# Patient Record
Sex: Male | Born: 1970 | Race: White | Hispanic: No | Marital: Single | Smoking: Never smoker
Health system: Southern US, Community
[De-identification: ages and names within clinical notes are randomized; demographics above are authoritative.]

## PROBLEM LIST (undated history)

## (undated) DIAGNOSIS — S065XAA Traumatic subdural hemorrhage with loss of consciousness status unknown, initial encounter: Secondary | ICD-10-CM

## (undated) DIAGNOSIS — F1910 Other psychoactive substance abuse, uncomplicated: Secondary | ICD-10-CM

## (undated) DIAGNOSIS — T7840XA Allergy, unspecified, initial encounter: Secondary | ICD-10-CM

## (undated) DIAGNOSIS — I10 Essential (primary) hypertension: Secondary | ICD-10-CM

## (undated) DIAGNOSIS — F32A Depression, unspecified: Secondary | ICD-10-CM

## (undated) DIAGNOSIS — J45909 Unspecified asthma, uncomplicated: Secondary | ICD-10-CM

## (undated) DIAGNOSIS — F329 Major depressive disorder, single episode, unspecified: Secondary | ICD-10-CM

## (undated) DIAGNOSIS — G43909 Migraine, unspecified, not intractable, without status migrainosus: Secondary | ICD-10-CM

## (undated) HISTORY — DX: Major depressive disorder, single episode, unspecified: F32.9

## (undated) HISTORY — DX: Unspecified asthma, uncomplicated: J45.909

## (undated) HISTORY — DX: Depression, unspecified: F32.A

## (undated) HISTORY — DX: Other psychoactive substance abuse, uncomplicated: F19.10

## (undated) HISTORY — DX: Essential (primary) hypertension: I10

## (undated) HISTORY — DX: Allergy, unspecified, initial encounter: T78.40XA

## (undated) HISTORY — PX: NO PAST SURGERIES: SHX2092

## (undated) HISTORY — DX: Traumatic subdural hemorrhage with loss of consciousness status unknown, initial encounter: S06.5XAA

## (undated) HISTORY — DX: Migraine, unspecified, not intractable, without status migrainosus: G43.909

---

## 2005-06-13 ENCOUNTER — Emergency Department: Payer: Self-pay | Admitting: Emergency Medicine

## 2017-08-07 ENCOUNTER — Other Ambulatory Visit: Payer: Self-pay | Admitting: Infectious Diseases

## 2017-08-07 DIAGNOSIS — F1010 Alcohol abuse, uncomplicated: Secondary | ICD-10-CM

## 2017-08-07 DIAGNOSIS — R7989 Other specified abnormal findings of blood chemistry: Secondary | ICD-10-CM

## 2017-08-07 DIAGNOSIS — R945 Abnormal results of liver function studies: Secondary | ICD-10-CM

## 2017-08-11 ENCOUNTER — Ambulatory Visit
Admission: RE | Admit: 2017-08-11 | Discharge: 2017-08-11 | Disposition: A | Discharge status: Home / Self Care | Payer: PRIVATE HEALTH INSURANCE | Source: Ambulatory Visit | Attending: Infectious Diseases | Admitting: Infectious Diseases

## 2017-08-11 DIAGNOSIS — R7989 Other specified abnormal findings of blood chemistry: Secondary | ICD-10-CM

## 2017-08-11 DIAGNOSIS — R945 Abnormal results of liver function studies: Secondary | ICD-10-CM | POA: Diagnosis present

## 2017-08-11 DIAGNOSIS — F1010 Alcohol abuse, uncomplicated: Secondary | ICD-10-CM

## 2017-12-18 DIAGNOSIS — G8929 Other chronic pain: Secondary | ICD-10-CM | POA: Insufficient documentation

## 2017-12-26 ENCOUNTER — Other Ambulatory Visit: Payer: Self-pay | Admitting: Internal Medicine

## 2017-12-26 DIAGNOSIS — M25511 Pain in right shoulder: Principal | ICD-10-CM

## 2017-12-26 DIAGNOSIS — G8929 Other chronic pain: Secondary | ICD-10-CM

## 2018-01-20 ENCOUNTER — Ambulatory Visit
Admission: RE | Admit: 2018-01-20 | Discharge: 2018-01-20 | Disposition: A | Discharge status: Home / Self Care | Payer: BLUE CROSS/BLUE SHIELD | Source: Ambulatory Visit | Attending: Internal Medicine | Admitting: Internal Medicine

## 2018-01-20 DIAGNOSIS — M25511 Pain in right shoulder: Principal | ICD-10-CM

## 2018-01-20 DIAGNOSIS — G8929 Other chronic pain: Secondary | ICD-10-CM

## 2018-02-01 DIAGNOSIS — M2550 Pain in unspecified joint: Secondary | ICD-10-CM | POA: Insufficient documentation

## 2019-01-02 ENCOUNTER — Ambulatory Visit: Payer: Self-pay

## 2019-01-03 ENCOUNTER — Ambulatory Visit: Payer: Self-pay | Admitting: Adult Health

## 2019-01-03 ENCOUNTER — Other Ambulatory Visit: Payer: Self-pay

## 2019-01-03 ENCOUNTER — Encounter: Payer: Self-pay | Admitting: Adult Health

## 2019-01-03 VITALS — BP 112/76 | HR 93 | Ht 67.0 in | Wt 170.0 lb

## 2019-01-03 DIAGNOSIS — J45909 Unspecified asthma, uncomplicated: Secondary | ICD-10-CM | POA: Insufficient documentation

## 2019-01-03 DIAGNOSIS — F1910 Other psychoactive substance abuse, uncomplicated: Secondary | ICD-10-CM | POA: Insufficient documentation

## 2019-01-03 DIAGNOSIS — J0111 Acute recurrent frontal sinusitis: Secondary | ICD-10-CM

## 2019-01-03 DIAGNOSIS — Z23 Encounter for immunization: Secondary | ICD-10-CM

## 2019-01-03 DIAGNOSIS — F1010 Alcohol abuse, uncomplicated: Secondary | ICD-10-CM | POA: Insufficient documentation

## 2019-01-03 DIAGNOSIS — F339 Major depressive disorder, recurrent, unspecified: Secondary | ICD-10-CM | POA: Insufficient documentation

## 2019-01-03 DIAGNOSIS — Z0000 Encounter for general adult medical examination without abnormal findings: Secondary | ICD-10-CM

## 2019-01-03 MED ORDER — FOLIC ACID 1 MG PO TABS: 1.0000 mg | ORAL_TABLET | Freq: Every day | ORAL | 2 refills | Status: DC

## 2019-01-03 MED ORDER — FLUTICASONE PROPIONATE HFA 110 MCG/ACT IN AERO: 1.0000 | INHALATION_SPRAY | Freq: Two times a day (BID) | RESPIRATORY_TRACT | 12 refills | Status: DC

## 2019-01-03 MED ORDER — ALBUTEROL SULFATE HFA 108 (90 BASE) MCG/ACT IN AERS: 2.0000 | INHALATION_SPRAY | RESPIRATORY_TRACT | 2 refills | Status: DC | PRN

## 2019-01-03 MED ORDER — CETIRIZINE HCL 10 MG PO TABS: 10.0000 mg | ORAL_TABLET | Freq: Every day | ORAL | 2 refills | Status: DC | PRN

## 2019-01-03 MED ORDER — FLUTICASONE PROPIONATE HFA 220 MCG/ACT IN AERO: 1.0000 | INHALATION_SPRAY | RESPIRATORY_TRACT | 2 refills | Status: DC

## 2019-01-03 MED ORDER — THERA VITAL M PO TABS: 1.0000 | ORAL_TABLET | Freq: Every day | ORAL | 2 refills | Status: DC

## 2019-01-03 MED ORDER — FLUTICASONE PROPIONATE 50 MCG/ACT NA SUSP: 2.0000 | Freq: Every day | NASAL | 2 refills | Status: DC

## 2019-01-03 MED ORDER — VITAMIN B-1 100 MG PO TABS: 100.0000 mg | ORAL_TABLET | Freq: Every day | ORAL | 2 refills | Status: DC

## 2019-01-03 MED ORDER — SERTRALINE HCL 100 MG PO TABS: 100.0000 mg | ORAL_TABLET | Freq: Every day | ORAL | 2 refills | Status: DC

## 2019-01-03 MED ORDER — SALINE SPRAY 0.65 % NA SOLN: 2.0000 | Freq: Four times a day (QID) | NASAL | 2 refills | Status: DC | PRN

## 2019-01-03 NOTE — Progress Notes (Signed)
Patient ID: Cameron [name], male   DOB: 1971/01/03, 48 y.o.   MRN: [id number]  Chief Complaint  Patient presents with  . New Patient (Initial Visit)    establish care    HPI Cameron [name] is a 48 y.o. male who presents for an initial office visit and evaluation of depression, polysubstance abuse and asthma due to allergies. He is c/o sinus congestion, drainage and PND that started few days ago and has gradually gotten worse. He denies fever, chills and muscle aches. Also complaining of pain in multiple joints that is on and off. He was screened for RA but was found to be negative.  He is currently in a drug treatment program for alcohol abuse.  He denies chest pain, palpitations, dizziness and headaches.  He is not up-to-date in all his immunizations.  He needs an influenza vaccine, Tdap and pneumonia.  He reports poor adherence to daily exercise.  He is currently a smokeless tobacco user.  Past Medical History:  Diagnosis Date  . Allergy   . Asthma   . Depression   . Substance abuse ([place])     History reviewed. No pertinent surgical history.  Family History  Problem Relation Age of Onset  . Diabetes Father     Social History Social History   Tobacco Use  . Smoking status: Never Smoker  . Smokeless tobacco: Current User    Types: Chew  Substance Use Topics  . Alcohol use: Not Currently  . Drug use: Never    No Known Allergies  Current Outpatient Medications  Medication Sig Dispense Refill  . fluticasone (FLOVENT HFA) 220 MCG/ACT inhaler Inhale into the lungs.    Marland Kitchen albuterol (PROVENTIL HFA;VENTOLIN HFA) 108 (90 Base) MCG/ACT inhaler     . sertraline (ZOLOFT) 100 MG tablet Take 100 mg by mouth daily.     No current facility-administered medications for this visit.     Review of Systems Review of Systems  Constitutional: Negative.   HENT: Positive for congestion, postnasal drip, rhinorrhea, sinus pressure and sinus pain.   Eyes: Negative.   Respiratory:  Positive for cough and wheezing. Negative for shortness of breath.   Cardiovascular: Negative.   Gastrointestinal: Negative.   Endocrine: Negative.   Genitourinary: Negative.   Musculoskeletal: Negative.   Skin: Negative.   Allergic/Immunologic: Positive for environmental allergies.  Neurological: Negative.   Hematological: Negative.   Psychiatric/Behavioral: Negative.     Blood pressure 112/76, pulse 93, height 5' 7"  (1.702 m), weight 170 lb (77.1 kg), SpO2 93 %.  Physical Exam Physical Exam Vitals signs and nursing note reviewed.  Constitutional:      Appearance: Normal appearance.  HENT:     Head: Normocephalic and atraumatic.     Comments: Pain with palpation of maxillary sinuses    Nose: Nose normal.     Mouth/Throat:     Mouth: Mucous membranes are dry.     Pharynx: Oropharynx is clear.  Eyes:     Extraocular Movements: Extraocular movements intact.     Conjunctiva/sclera: Conjunctivae normal.     Pupils: Pupils are equal, round, and reactive to light.  Neck:     Musculoskeletal: Normal range of motion and neck supple.  Cardiovascular:     Rate and Rhythm: Normal rate and regular rhythm.     Pulses: Normal pulses.     Heart sounds: Normal heart sounds.  Pulmonary:     Effort: Pulmonary effort is normal.     Breath sounds: Normal breath  sounds. No wheezing or rhonchi.  Abdominal:     General: Bowel sounds are normal. There is no distension.     Palpations: Abdomen is soft.     Tenderness: There is no abdominal tenderness. There is no guarding.  Genitourinary:    Comments: Deferred per patient's preference Musculoskeletal: Normal range of motion.  Skin:    General: Skin is warm and dry.     Capillary Refill: Capillary refill takes less than 2 seconds.  Neurological:     General: No focal deficit present.     Mental Status: He is alert and oriented to person, place, and time.  Psychiatric:        Mood and Affect: Mood normal.        Behavior: Behavior normal.      Assessment and plan 1. Need for immunization against influenza Flu shot given - Flu Vaccine QUAD 6+ mos IM (Fluarix)  2. Health maintenance examination We will obtain baseline labs and screen for cardiovascular risk factors. - CBC w/Diff - Comp Met (CMET) - Lipid Profile - TSH - HgB A1c - Magnesium - Phosphorus - Vitamin B1 - Folate  3. Asthma due to environmental allergies Continue rescue inhaler and Flovent - DG Chest 2 View; Future - Ambulatory referral to Pulmonology  4. Depression, recurrent ([place]) Continue follow-up with psychiatrist, counselor and continue Zoloft 100 mg nightly  5. Substance abuse (HCC)-Alcohol abuse We will start on thiamine, folic acid and multivitamin.  Will obtain magnesium and phosphate B12 and folate levels - Magnesium - Phosphorus - Vitamin B1 - Folate  6. Acute recurrent frontal sinusitis Likely due to seasonal allergies.  Will start on Flonase 2 sprays in each nostril at bedtime and saline nasal spray 2 sprays in each nostril every 4 hours as needed.  He is not febrile and does not have any signs of active infection.  Will hold off on antibiotics at this point.  Patient encouraged to return to the clinic or go to the emergency room if symptoms worsen.    Cameron [name] 01/03/2019, 9:36 AM

## 2019-01-04 LAB — COMPREHENSIVE METABOLIC PANEL
ALT: 16 IU/L (ref 0–44)
AST: 21 IU/L (ref 0–40)
Albumin/Globulin Ratio: 1.9 (ref 1.2–2.2)
Albumin: 4.6 g/dL (ref 4.0–5.0)
Alkaline Phosphatase: 63 IU/L (ref 39–117)
BUN/Creatinine Ratio: 8 — ABNORMAL LOW (ref 9–20)
BUN: 9 mg/dL (ref 6–24)
Bilirubin Total: 0.4 mg/dL (ref 0.0–1.2)
CO2: 25 mmol/L (ref 20–29)
Calcium: 9.5 mg/dL (ref 8.7–10.2)
Chloride: 100 mmol/L (ref 96–106)
Creatinine, Ser: 1.09 mg/dL (ref 0.76–1.27)
GFR calc Af Amer: 93 mL/min/{1.73_m2} (ref >59)
GFR calc non Af Amer: 80 mL/min/{1.73_m2} (ref >59)
Globulin, Total: 2.4 g/dL (ref 1.5–4.5)
Glucose: 83 mg/dL (ref 65–99)
Potassium: 4.6 mmol/L (ref 3.5–5.2)
Sodium: 140 mmol/L (ref 134–144)
Total Protein: 7 g/dL (ref 6.0–8.5)

## 2019-01-04 LAB — CBC WITH DIFFERENTIAL/PLATELET
Basophils Absolute: 0.2 10*3/uL (ref 0.0–0.2)
Basos: 3 %
EOS (ABSOLUTE): 1.1 10*3/uL — ABNORMAL HIGH (ref 0.0–0.4)
Eos: 15 %
Hematocrit: 46.3 % (ref 37.5–51.0)
Hemoglobin: 15.7 g/dL (ref 13.0–17.7)
Immature Grans (Abs): 0 10*3/uL (ref 0.0–0.1)
Immature Granulocytes: 0 %
Lymphocytes Absolute: 1.7 10*3/uL (ref 0.7–3.1)
Lymphs: 23 %
MCH: 30.4 pg (ref 26.6–33.0)
MCHC: 33.9 g/dL (ref 31.5–35.7)
MCV: 90 fL (ref 79–97)
Monocytes Absolute: 0.9 10*3/uL (ref 0.1–0.9)
Monocytes: 12 %
Neutrophils Absolute: 3.4 10*3/uL (ref 1.4–7.0)
Neutrophils: 47 %
Platelets: 243 10*3/uL (ref 150–450)
RBC: 5.17 x10E6/uL (ref 4.14–5.80)
RDW: 15.3 % (ref 11.6–15.4)
WBC: 7.2 10*3/uL (ref 3.4–10.8)

## 2019-01-04 LAB — PHOSPHORUS: Phosphorus: 3.6 mg/dL (ref 2.8–4.1)

## 2019-01-04 LAB — LIPID PANEL
Chol/HDL Ratio: 3.6 ratio (ref 0.0–5.0)
Cholesterol, Total: 132 mg/dL (ref 100–199)
HDL: 37 mg/dL — ABNORMAL LOW (ref >39)
LDL Calculated: 74 mg/dL (ref 0–99)
Triglycerides: 104 mg/dL (ref 0–149)
VLDL Cholesterol Cal: 21 mg/dL (ref 5–40)

## 2019-01-04 LAB — TSH: TSH: 2.37 u[IU]/mL (ref 0.450–4.500)

## 2019-01-04 LAB — FOLATE: Folate: 8 ng/mL (ref >3.0)

## 2019-01-04 LAB — VITAMIN B1

## 2019-01-04 LAB — HEMOGLOBIN A1C
Est. average glucose Bld gHb Est-mCnc: 108 mg/dL
Hgb A1c MFr Bld: 5.4 % (ref 4.8–5.6)

## 2019-01-04 LAB — MAGNESIUM: Magnesium: 2.2 mg/dL (ref 1.6–2.3)

## 2019-01-05 ENCOUNTER — Encounter: Payer: Self-pay | Admitting: Adult Health

## 2019-01-10 ENCOUNTER — Ambulatory Visit: Payer: Self-pay | Admitting: Pharmacy Technician

## 2019-01-10 DIAGNOSIS — Z79899 Other long term (current) drug therapy: Secondary | ICD-10-CM

## 2019-01-10 NOTE — Progress Notes (Signed)
Completed [facility] application and contract.  Patient agreed to all terms of the [facility] contract.    Patient approved to receive medication assistance at [facility] as long as eligibility criteria continue to be met.    Provided patient with Civil engineer, contracting based on his particular needs.    Flovent Prescription Application completed with patient.  Forwarded to [facility] for signature.  Upon receipt of signed application from provider, Flovent Prescription Application will be submitted to [place].  Patient stated that he has an outstanding bill with [facility].  Provided patient with contact information and application to apply for charity care for [facility].  [facility]

## 2019-01-16 ENCOUNTER — Ambulatory Visit: Payer: Self-pay | Admitting: Gerontology

## 2019-01-16 ENCOUNTER — Encounter: Payer: Self-pay | Admitting: Gerontology

## 2019-01-16 ENCOUNTER — Other Ambulatory Visit: Payer: Self-pay

## 2019-01-16 VITALS — BP 110/76 | HR 95 | Ht 67.0 in | Wt 170.9 lb

## 2019-01-16 DIAGNOSIS — F339 Major depressive disorder, recurrent, unspecified: Secondary | ICD-10-CM

## 2019-01-16 DIAGNOSIS — J45909 Unspecified asthma, uncomplicated: Secondary | ICD-10-CM

## 2019-01-16 DIAGNOSIS — J0111 Acute recurrent frontal sinusitis: Secondary | ICD-10-CM

## 2019-01-16 DIAGNOSIS — F1910 Other psychoactive substance abuse, uncomplicated: Secondary | ICD-10-CM

## 2019-01-16 MED ORDER — ACETAMINOPHEN 325 MG PO TABS: 650.0000 mg | ORAL_TABLET | Freq: Four times a day (QID) | ORAL | Status: DC | PRN

## 2019-01-16 NOTE — Progress Notes (Signed)
Established Patient Office Visit  Subjective:  Patient ID: Cameron Cameron [name], male    DOB: 1971-03-08  Age: 48 y.o. MRN: [id number]  CC:  Chief Complaint  Patient presents with  . Follow-up    HPI Cameron Cameron [name] Cameron [name] presents for follow up for acute sinus pressure, depression, polysubstance abuse and asthma due to allergies. He continues his treatment for alcohol abuse at [facility].  Asthma: He was scheduled for chest x ray but states that he completed the charity care application and he's waiting to find out when and where to go for chest x ray. He denies chest tightness, chest pain ,shortness of breath and states that he uses albuterol as needed and Flonase 2 sprays at bedtime. He denies fever and chills and continues to have greenish yellow nasal discharge.  Sinus Pressure: He reports that he takes 400 mg Ibuprofen once a day and it has not helped with his sinus pain. He reports having non radiating constant dull 3/10 sinus pain that has being going on for 2 weeks. He reports that he uses saline spray with minimal relief and the rehab center declined giving him tylenol because it has to be prescribed.   Depression: He states that he continues to take 100 mg of Zoloft and will find out the mental health provider that [facility] uses. He states that he's tired from dealing with sinus infection and pain for the past 2 weeks.  Substance abuse (HCC)- Alcohol abuse: He reports that he continues with his rehab treatment at [facility] and was pleased that his lab has greatly improved. Otherwise he denies any further concerns.     Past Medical History:  Diagnosis Date  . Allergy   . Asthma   . Depression   . Substance abuse (HCC)     History reviewed. No pertinent surgical history.  Family History  Problem Relation Age of Onset  . Diabetes Father     Social History   Socioeconomic History  . Marital status: Single    Spouse name: Not on file  . Number of children: Not on file  . Years  of education: Not on file  . Highest education level: Not on file  Occupational History  . Occupation: unemployed  Social Needs  . Financial resource strain: Not on file  . Food insecurity:    Worry: Not on file    Inability: Not on file  . Transportation needs:    Medical: Not on file    Non-medical: Not on file  Tobacco Use  . Smoking status: Never Smoker  . Smokeless tobacco: Current User    Types: Chew  Substance and Sexual Activity  . Alcohol use: Not Currently  . Drug use: Never  . Sexual activity: Not on file  Lifestyle  . Physical activity:    Days per week: Not on file    Minutes per session: Not on file  . Stress: Not on file  Relationships  . Social connections:    Talks on phone: Not on file    Gets together: Not on file    Attends religious service: Not on file    Active member of club or organization: Not on file    Attends meetings of clubs or organizations: Not on file    Relationship status: Not on file  . Intimate partner violence:    Fear of current or ex partner: Not on file    Emotionally abused: Not on file    Physically abused: Not on  file    Forced sexual activity: Not on file  Other Topics Concern  . Not on file  Social History Narrative  . Not on file    Outpatient Medications Prior to Visit  Medication Sig Dispense Refill  . albuterol (PROVENTIL HFA;VENTOLIN HFA) 108 (90 Base) MCG/ACT inhaler Inhale 2 puffs into the lungs every 4 (four) hours as needed for wheezing or shortness of breath. 1 Inhaler 2  . cetirizine (ZYRTEC ALLERGY) 10 MG tablet Take 1 tablet (10 mg total) by mouth daily as needed for allergies. 30 tablet 2  . fluticasone (FLONASE) 50 MCG/ACT nasal spray Place 2 sprays into both nostrils at bedtime. 16 g 2  . fluticasone (FLOVENT HFA) 110 MCG/ACT inhaler Inhale 1 puff into the lungs 2 (two) times daily. 1 Inhaler 12  . folic acid (FOLVITE) 1 MG tablet Take 1 tablet (1 mg total) by mouth daily. 90 tablet 2  . Multiple  Vitamins-Minerals (MULTIVITAMIN) tablet Take 1 tablet by mouth daily. 90 tablet 2  . sertraline (ZOLOFT) 100 MG tablet Take 1 tablet (100 mg total) by mouth at bedtime. 90 tablet 2  . sodium chloride (OCEAN) 0.65 % SOLN nasal spray Place 2 sprays into both nostrils 4 (four) times daily as needed for congestion. 15 mL 2  . thiamine (VITAMIN B-1) 100 MG tablet Take 1 tablet (100 mg total) by mouth daily. 90 tablet 2   No facility-administered medications prior to visit.     No Known Allergies  ROS Review of Systems  Constitutional: Negative.   HENT: Positive for sinus pressure and sinus pain.   Eyes: Visual disturbance: blurry vision.  Respiratory: Negative.   Cardiovascular: Negative.   Gastrointestinal: Negative.   Neurological: Negative.   Psychiatric/Behavioral: Negative.       Objective:    Physical Exam  Constitutional: He is oriented to person, place, and time. He appears well-developed and well-nourished.  HENT:  Head: Normocephalic and atraumatic.  Right Ear: Hearing, tympanic membrane, external ear and ear canal normal.  Left Ear: Hearing, tympanic membrane, external ear and ear canal normal.  Nose: No mucosal edema, rhinorrhea, sinus tenderness, nasal deformity or septal deviation. Right sinus exhibits maxillary sinus tenderness and frontal sinus tenderness (3/10 tenderness with palpation). Left sinus exhibits maxillary sinus tenderness and frontal sinus tenderness (3/10 tenderness with palpation).  Mouth/Throat: Uvula is midline and mucous membranes are normal. No oropharyngeal exudate, posterior oropharyngeal edema or tonsillar abscesses.  Eyes: Pupils are equal, round, and reactive to light. EOM are normal.  Neck: Normal range of motion.  Cardiovascular: Normal rate and regular rhythm.  Pulmonary/Chest: Effort normal and breath sounds normal. No respiratory distress. He has no wheezes. He has no rales. He exhibits no tenderness.  Abdominal: Soft.  Neurological: He is  alert and oriented to person, place, and time.  Skin: Skin is warm.  Psychiatric: He has a normal mood and affect. His behavior is normal. Judgment and thought content normal.    BP 110/76 (BP Location: Left Arm, Patient Position: Sitting)   Pulse 95   Ht 5\' 7"  (1.702 m)   Wt 170 lb 14.4 oz (77.5 kg)   SpO2 94%   BMI 26.77 kg/m  Wt Readings from Last 3 Encounters:  01/16/19 170 lb 14.4 oz (77.5 kg)  01/03/19 170 lb (77.1 kg)     Health Maintenance Due  Topic Date Due  . HIV Screening  11/04/1986  . TETANUS/TDAP  11/04/1990    There are no preventive care reminders to display  for this patient.  Lab Results  Component Value Date   TSH 2.370 01/03/2019   Lab Results  Component Value Date   WBC 7.2 01/03/2019   HGB 15.7 01/03/2019   HCT 46.3 01/03/2019   MCV 90 01/03/2019   PLT 243 01/03/2019   Lab Results  Component Value Date   NA 140 01/03/2019   K 4.6 01/03/2019   CO2 25 01/03/2019   GLUCOSE 83 01/03/2019   BUN 9 01/03/2019   CREATININE 1.09 01/03/2019   BILITOT 0.4 01/03/2019   ALKPHOS 63 01/03/2019   AST 21 01/03/2019   ALT 16 01/03/2019   PROT 7.0 01/03/2019   ALBUMIN 4.6 01/03/2019   CALCIUM 9.5 01/03/2019   Lab Results  Component Value Date   CHOL 132 01/03/2019   Lab Results  Component Value Date   HDL 37 (L) 01/03/2019    He was advised to exercise 30 minutes daily and continue on low fat and low cholesterol diet. Lab Results  Component Value Date   LDLCALC 74 01/03/2019   Lab Results  Component Value Date   TRIG 104 01/03/2019   Lab Results  Component Value Date   CHOLHDL 3.6 01/03/2019   Lab Results  Component Value Date   HGBA1C 5.4 01/03/2019      Assessment & Plan:   Problem List Items Addressed This Visit      Respiratory   Asthma due to environmental allergies - Primary     Other   Depression, recurrent (HCC)   Substance abuse (HCC)-Alcohol abuse    Other Visit Diagnoses    Acute recurrent frontal sinusitis        Relevant Medications   acetaminophen (TYLENOL) tablet 650 mg      Meds ordered this encounter  Medications  . acetaminophen (TYLENOL) tablet 650 mg  1. Acute recurrent frontal sinusitis - He will continue current treatment regimen, it's likely viral sinusitis, advised patient to increase fluid intake and use saline spray and will have chest x ray done to rule out CAP. He was provided with where to get the chest x ray done. - acetaminophen (TYLENOL) tablet 650 mg every 6 hours as needed.  2. Asthma due to environmental allergies - He will continue on current treatment regimen and follow up with chest x ray.  3. Depression, recurrent (HCC) - He will continue on 100 mg Zoloft and follow up with psychiatry   4. Substance abuse (HCC)-Alcohol abuse - He continues rehab at [facility].    Follow-up: Return in about 2 weeks (around 01/30/2019), or if symptoms worsen or fail to improve.    Treyvon Blahut Trellis Paganini, NP

## 2019-01-24 ENCOUNTER — Other Ambulatory Visit: Payer: Self-pay

## 2019-01-24 ENCOUNTER — Ambulatory Visit
Admission: RE | Admit: 2019-01-24 | Discharge: 2019-01-24 | Disposition: A | Discharge status: Home / Self Care | Payer: Self-pay | Source: Ambulatory Visit | Attending: Adult Health | Admitting: Adult Health

## 2019-01-24 DIAGNOSIS — J45909 Unspecified asthma, uncomplicated: Secondary | ICD-10-CM | POA: Insufficient documentation

## 2019-01-30 ENCOUNTER — Telehealth: Payer: Self-pay | Admitting: Pharmacist

## 2019-01-30 NOTE — Telephone Encounter (Signed)
01/30/2019 10:21:10 AM - Flovent HFA & Ventolin HFA to [employer]  01/30/2019 Faxed [facility] enrollment application for Flovent HFA 110 mcg Inhale 1 puff into the lungs two times a day & Ventolin HFA Inhale 2 puffs into the lungs every 4 hours as needed for wheezing or shortness of breath.Cameron [name]

## 2019-01-31 ENCOUNTER — Other Ambulatory Visit: Payer: Self-pay

## 2019-01-31 ENCOUNTER — Encounter: Payer: Self-pay | Admitting: Adult Health

## 2019-01-31 ENCOUNTER — Ambulatory Visit: Payer: Self-pay | Admitting: Adult Health

## 2019-01-31 VITALS — BP 110/74 | HR 83 | Temp 98.0°F | Ht 67.0 in | Wt 169.6 lb

## 2019-01-31 DIAGNOSIS — M5430 Sciatica, unspecified side: Secondary | ICD-10-CM

## 2019-01-31 DIAGNOSIS — F1910 Other psychoactive substance abuse, uncomplicated: Secondary | ICD-10-CM

## 2019-01-31 DIAGNOSIS — F339 Major depressive disorder, recurrent, unspecified: Secondary | ICD-10-CM

## 2019-01-31 DIAGNOSIS — J45909 Unspecified asthma, uncomplicated: Secondary | ICD-10-CM

## 2019-01-31 DIAGNOSIS — M549 Dorsalgia, unspecified: Principal | ICD-10-CM

## 2019-01-31 MED ORDER — PREDNISONE 10 MG (21) PO TBPK: ORAL_TABLET | ORAL | 0 refills | Status: DC

## 2019-01-31 MED ORDER — IBUPROFEN 800 MG PO TABS: 800.0000 mg | ORAL_TABLET | Freq: Three times a day (TID) | ORAL | 0 refills | Status: DC | PRN

## 2019-01-31 MED ORDER — VITAMIN B-1 100 MG PO TABS: 100.0000 mg | ORAL_TABLET | Freq: Every day | ORAL | 3 refills | Status: DC

## 2019-01-31 MED ORDER — FEXOFENADINE HCL 60 MG PO TABS: 60.0000 mg | ORAL_TABLET | Freq: Two times a day (BID) | ORAL | 3 refills | Status: DC

## 2019-01-31 NOTE — Patient Instructions (Signed)
Sciatica    Sciatica is pain, numbness, weakness, or tingling along your sciatic nerve. The sciatic nerve starts in the lower back and goes down the back of each leg. Sciatica happens when this nerve is pinched or has pressure put on it. Sciatica usually goes away on its own or with treatment. Sometimes, sciatica may keep coming back (recur).  Follow these instructions at home:  Medicines  · Take over-the-counter and prescription medicines only as told by your doctor.  · Do not drive or use heavy machinery while taking prescription pain medicine.  Managing pain  · If directed, put ice on the affected area.  ? Put ice in a plastic bag.  ? Place a towel between your skin and the bag.  ? Leave the ice on for 20 minutes, 2-3 times a day.  · After icing, apply heat to the affected area before you exercise or as often as told by your doctor. Use the heat source that your doctor tells you to use, such as a moist heat pack or a heating pad.  ? Place a towel between your skin and the heat source.  ? Leave the heat on for 20-30 minutes.  ? Remove the heat if your skin turns bright red. This is especially important if you are unable to feel pain, heat, or cold. You may have a greater risk of getting burned.  Activity  · Return to your normal activities as told by your doctor. Ask your doctor what activities are safe for you.  ? Avoid activities that make your sciatica worse.  · Take short rests during the day. Rest in a lying or standing position. This is usually better than sitting to rest.  ? When you rest for a long time, do some physical activity or stretching between periods of rest.  ? Avoid sitting for a long time without moving. Get up and move around at least one time each hour.  · Exercise and stretch regularly, as told by your doctor.  · Do not lift anything that is heavier than 10 lb (4.5 kg) while you have symptoms of sciatica.  ? Avoid lifting heavy things even when you do not have symptoms.  ? Avoid lifting  heavy things over and over.  · When you lift objects, always lift in a way that is safe for your body. To do this, you should:  ? Bend your knees.  ? Keep the object close to your body.  ? Avoid twisting.  General instructions  · Use good posture.  ? Avoid leaning forward when you are sitting.  ? Avoid hunching over when you are standing.  · Stay at a healthy weight.  · Wear comfortable shoes that support your feet. Avoid wearing high heels.  · Avoid sleeping on a mattress that is too soft or too hard. You might have less pain if you sleep on a mattress that is firm enough to support your back.  · Keep all follow-up visits as told by your doctor. This is important.  Contact a doctor if:  · You have pain that:  ? Wakes you up when you are sleeping.  ? Gets worse when you lie down.  ? Is worse than the pain you have had in the past.  ? Lasts longer than 4 weeks.  · You lose weight for without trying.  Get help right away if:  · You cannot control when you pee (urinate) or poop (have a bowel movement).  · You   have weakness in any of these areas and it gets worse.  ? Lower back.  ? Lower belly (pelvis).  ? Butt (buttocks).  ? Legs.  · You have redness or swelling of your back.  · You have a burning feeling when you pee.  This information is not intended to replace advice given to you by your health care provider. Make sure you discuss any questions you have with your health care provider.  Document Released: 08/16/2008 Document Revised: 04/14/2016 Document Reviewed: 07/17/2015  Elsevier Interactive Patient Education © 2019 [employer]    Back Exercises  If you have pain in your back, do these exercises 2-3 times each day or as told by your doctor. When the pain goes away, do the exercises once each day, but repeat the steps more times for each exercise (do more repetitions). If you do not have pain in your back, do these exercises once each day or as told by your doctor.  Exercises  Single Knee to Chest  Do these  steps 3-5 times in a row for each leg:  1. Lie on your back on a firm bed or the floor with your legs stretched out.  2. Bring one knee to your chest.  3. Hold your knee to your chest by grabbing your knee or thigh.  4. Pull on your knee until you feel a gentle stretch in your lower back.  5. Keep doing the stretch for 10-30 seconds.  6. Slowly let go of your leg and straighten it.  Pelvic Tilt  Do these steps 5-10 times in a row:  1. Lie on your back on a firm bed or the floor with your legs stretched out.  2. Bend your knees so they point up to the ceiling. Your feet should be flat on the floor.  3. Tighten your lower belly (abdomen) muscles to press your lower back against the floor. This will make your tailbone point up to the ceiling instead of pointing down to your feet or the floor.  4. Stay in this position for 5-10 seconds while you gently tighten your muscles and breathe evenly.  Cat-Cow  Do these steps until your lower back bends more easily:  1. Get on your hands and knees on a firm surface. Keep your hands under your shoulders, and keep your knees under your hips. You may put padding under your knees.  2. Let your head hang down, and make your tailbone point down to the floor so your lower back is round like the back of a cat.  3. Stay in this position for 5 seconds.  4. Slowly lift your head and make your tailbone point up to the ceiling so your back hangs low (sags) like the back of a cow.  5. Stay in this position for 5 seconds.    Press-Ups  Do these steps 5-10 times in a row:  1. Lie on your belly (face-down) on the floor.  2. Place your hands near your head, about shoulder-width apart.  3. While you keep your back relaxed and keep your hips on the floor, slowly straighten your arms to raise the top half of your body and lift your shoulders. Do not use your back muscles. To make yourself more comfortable, you may change where you place your hands.  4. Stay in this position for 5 seconds.  5. Slowly  return to lying flat on the floor.    Bridges  Do these steps 10 times in a   row:  1. Lie on your back on a firm surface.  2. Bend your knees so they point up to the ceiling. Your feet should be flat on the floor.  3. Tighten your butt muscles and lift your butt off of the floor until your waist is almost as high as your knees. If you do not feel the muscles working in your butt and the back of your thighs, slide your feet 1-2 inches farther away from your butt.  4. Stay in this position for 3-5 seconds.  5. Slowly lower your butt to the floor, and let your butt muscles relax.  If this exercise is too easy, try doing it with your arms crossed over your chest.  Belly Crunches  Do these steps 5-10 times in a row:  1. Lie on your back on a firm bed or the floor with your legs stretched out.  2. Bend your knees so they point up to the ceiling. Your feet should be flat on the floor.  3. Cross your arms over your chest.  4. Tip your chin a little bit toward your chest but do not bend your neck.  5. Tighten your belly muscles and slowly raise your chest just enough to lift your shoulder blades a tiny bit off of the floor.  6. Slowly lower your chest and your head to the floor.  Back Lifts  Do these steps 5-10 times in a row:  1. Lie on your belly (face-down) with your arms at your sides, and rest your forehead on the floor.  2. Tighten the muscles in your legs and your butt.  3. Slowly lift your chest off of the floor while you keep your hips on the floor. Keep the back of your head in line with the curve in your back. Look at the floor while you do this.  4. Stay in this position for 3-5 seconds.  5. Slowly lower your chest and your face to the floor.  Contact a doctor if:  · Your back pain gets a lot worse when you do an exercise.  · Your back pain does not lessen 2 hours after you exercise.  If you have any of these problems, stop doing the exercises. Do not do them again unless your doctor says it is okay.  Get help  right away if:  · You have sudden, very bad back pain. If this happens, stop doing the exercises. Do not do them again unless your doctor says it is okay.  This information is not intended to replace advice given to you by your health care provider. Make sure you discuss any questions you have with your health care provider.  Document Released: 12/10/2010 Document Revised: 08/01/2018 Document Reviewed: 01/01/2015  Elsevier Interactive Patient Education © 2019 [employer]

## 2019-01-31 NOTE — Progress Notes (Signed)
Patient: Cameron [name] Male    DOB: Feb 26, 1971   48 y.o.   MRN: [id number] Visit Date: 01/31/2019  Today's Provider: Shawn Route, NP   Chief Complaint  Patient presents with  . Follow-up    CXR 1 wk ago for possible infection. Pt states other than sinus/head congestion from allergies, feels fine.   Subjective:    HPI: This a 48 y/o  male who presents for f/u CXR, asthma and back pain. His CXR showed hyperinflation consistent with asthma. He reports persistent nasal and sinus congestion which he attributes to seasonal allergies. He denies cough, sputum, fever and recent travel. He was prescribed zyrtec but states that the alcohol treatment facility where he currently lives does not allow him to take Zyrtec or any other antihistamine.  He is on albuterol and Flovent and reports decreased albuterol use since resuming Flovent. He uses snuff but denies cigarette use. He is c/o back pain that is chronic; rated as mild, aggravate by activity and relived by rest and OTC pain remedies. Pain radiates down right leg. He is not currently taking any medications for it. He also reports bursitis and rotator cuff injury of the right shoulder. Per his last visit notes at [facility], he was not deemed to have any rotator cuff pathology that might warrant surgery. His MRI showed chronic bursitis but given that he was asymptomatic, he was asked to resume home exercises. He has had multiple otho evaluations for different MS complaints.     No Known Allergies Previous Medications   ALBUTEROL (PROVENTIL HFA;VENTOLIN HFA) 108 (90 BASE) MCG/ACT INHALER    Inhale 2 puffs into the lungs every 4 (four) hours as needed for wheezing or shortness of breath.   CETIRIZINE (ZYRTEC ALLERGY) 10 MG TABLET    Take 1 tablet (10 mg total) by mouth daily as needed for allergies.   FLUTICASONE (FLONASE) 50 MCG/ACT NASAL SPRAY    Place 2 sprays into both nostrils at bedtime.   FLUTICASONE (FLOVENT HFA) 110 MCG/ACT INHALER     Inhale 1 puff into the lungs 2 (two) times daily.   FOLIC ACID (FOLVITE) 1 MG TABLET    Take 1 tablet (1 mg total) by mouth daily.   MULTIPLE VITAMINS-MINERALS (MULTIVITAMIN) TABLET    Take 1 tablet by mouth daily.   SERTRALINE (ZOLOFT) 100 MG TABLET    Take 1 tablet (100 mg total) by mouth at bedtime.   SODIUM CHLORIDE (OCEAN) 0.65 % SOLN NASAL SPRAY    Place 2 sprays into both nostrils 4 (four) times daily as needed for congestion.   THIAMINE (VITAMIN B-1) 100 MG TABLET    Take 1 tablet (100 mg total) by mouth daily.    Review of Systems  Constitutional: Negative.   HENT: Positive for congestion, postnasal drip, rhinorrhea and sinus pressure.   Respiratory: Negative.   Musculoskeletal: Positive for back pain.  Skin: Negative.   Neurological: Negative.   Hematological: Negative.     Social History   Tobacco Use  . Smoking status: Never Smoker  . Smokeless tobacco: Current User    Types: Chew  Substance Use Topics  . Alcohol use: Not Currently   Objective:   BP 110/74 (BP Location: Left Arm, Patient Position: Sitting)   Pulse 83   Temp 98 F (36.7 C) (Oral)   Ht 5\' 7"  (1.702 m)   Wt 169 lb 9.6 oz (76.9 kg)   SpO2 98%   BMI 26.56 kg/m   Physical Exam Vitals signs  and nursing note reviewed.  Constitutional:      Appearance: Normal appearance.  HENT:     Nose: Rhinorrhea present.     Mouth/Throat:     Mouth: Mucous membranes are moist.     Pharynx: Posterior oropharyngeal erythema present.  Cardiovascular:     Rate and Rhythm: Normal rate.     Pulses: Normal pulses.     Heart sounds: Normal heart sounds.  Pulmonary:     Effort: Pulmonary effort is normal.     Breath sounds: Normal breath sounds.  Musculoskeletal: Normal range of motion.        General: No tenderness or deformity.  Skin:    General: Skin is warm and dry.  Neurological:     Mental Status: He is alert.       Assessment & Plan:     1. Back pain with sciatica Chronic. Back exercises reviewed  and literature provided. Motrin 800mg  tid prn  2. Asthma due to environmental allergies No acute exacerbation. Continue prn albuterol and flovent  3. Substance abuse (HCC)-Alcohol abuse Continue treatment program. Will add thiamine to folate  4. Depression, recurrent (HCC) Mood is stable. Continue current medications    Shawn Route, NP   [facility]

## 2019-02-14 ENCOUNTER — Ambulatory Visit: Payer: Self-pay | Admitting: Ophthalmology

## 2019-04-18 ENCOUNTER — Telehealth: Payer: Self-pay | Admitting: Pharmacist

## 2019-04-18 NOTE — Telephone Encounter (Signed)
04/18/2019 9:05:17 AM - Ventolin HFA refill  04/18/2019 Placed refill online with [facility] for Ventolin HFA, to ship 05/01/2019, order# [id number].Forde Radon

## 2019-04-26 ENCOUNTER — Other Ambulatory Visit: Payer: Self-pay | Admitting: Internal Medicine

## 2019-05-02 ENCOUNTER — Encounter: Payer: Self-pay | Admitting: Adult Health

## 2019-05-02 ENCOUNTER — Other Ambulatory Visit: Payer: Self-pay

## 2019-05-02 ENCOUNTER — Ambulatory Visit: Payer: Self-pay | Admitting: Adult Health

## 2019-05-02 DIAGNOSIS — R5383 Other fatigue: Secondary | ICD-10-CM | POA: Insufficient documentation

## 2019-05-02 DIAGNOSIS — M2550 Pain in unspecified joint: Secondary | ICD-10-CM | POA: Insufficient documentation

## 2019-05-02 DIAGNOSIS — J45909 Unspecified asthma, uncomplicated: Secondary | ICD-10-CM

## 2019-05-02 MED ORDER — PREDNISONE 10 MG (21) PO TBPK: ORAL_TABLET | ORAL | 0 refills | Status: DC

## 2019-05-02 NOTE — Progress Notes (Signed)
  Patient: Cameron [name] Male    DOB: 11-24-1970   48 y.o.   MRN: [id number] Visit Date: 05/02/2019  Today's Provider: Deforest Hoyles, NP  Patient consents to Telephone/or Telehealth visit and two identifiers have been used to establish patient's identity prior to visit  Chief Complaint  Patient presents with  . Follow-up    back pain, allergies, depression   Subjective:    HPI This is a 48 y/o male seen via Telephone visit for fatigue, depression, seasonal allergies and back pain. He states that overall he is doing ok except for fatigue. He is on zyrtec and Flonase and reports good symptom control. He is on sertraline for depression and reports good sleep quality. He denies any SI or major medication side effects. He reports generalized fatigue that started a few weeks ago. No associated symptoms except for low libido. He states that he might have low testosterone levels. He reports taking all his medications as prescribed.   No Known Allergies Previous Medications   ALBUTEROL (PROVENTIL HFA;VENTOLIN HFA) 108 (90 BASE) MCG/ACT INHALER    Inhale 2 puffs into the lungs every 4 (four) hours as needed for wheezing or shortness of breath.   CETIRIZINE (ZYRTEC) 10 MG TABLET    Take 10 mg by mouth daily.   FLUTICASONE (FLONASE) 50 MCG/ACT NASAL SPRAY    Place 2 sprays into both nostrils at bedtime.   FLUTICASONE (FLOVENT HFA) 110 MCG/ACT INHALER    Inhale 1 puff into the lungs 2 (two) times daily.   IBUPROFEN (ADVIL,MOTRIN) 800 MG TABLET    Take 1 tablet (800 mg total) by mouth every 8 (eight) hours as needed for fever, headache, mild pain, moderate pain or cramping.   SERTRALINE (ZOLOFT) 100 MG TABLET    TAKE ONE TABLET BY MOUTH AT BEDTIME    Review of Systems  Constitutional: Positive for fatigue. Negative for activity change and appetite change.  HENT: Positive for congestion and sneezing.   Respiratory: Negative.   Cardiovascular: Negative.   Allergic/Immunologic: Positive  for environmental allergies.  Psychiatric/Behavioral: Negative for dysphoric mood, self-injury, sleep disturbance and suicidal ideas.    Social History   Tobacco Use  . Smoking status: Never Smoker  . Smokeless tobacco: Current User    Types: Chew  Substance Use Topics  . Alcohol use: Not Currently   Objective:   There were no vitals taken for this visit.  Physical Exam  Unable to obtain as this is a telephonic visit    Assessment & Plan:  1. Asthma due to environmental allergies Well controlled. Continue current medciations  2. Fatigue, unspecified type -Likely related to depression but will r/o low testosterone and vitamin eficiencies - Testosterone; Future - Vitamin D 1,25 dihydroxy; Future - B12; Future  3. Arthralgia, unspecified joint Back and left shoulder pain that is chronic. Continue prn motrin, daily back exercises and prednisone taper.  Deforest Hoyles, NP   [facility]

## 2019-07-17 ENCOUNTER — Telehealth: Payer: Self-pay | Admitting: Pharmacist

## 2019-07-17 NOTE — Telephone Encounter (Signed)
07/17/2019 10:46:52 AM - Ventolin & Flovent HFA refills  07/17/2019 Placed refill online with [facility] for Ventolin HFA to ship 07/31/2019, order# [id number], also for Flovent HFA to ship 07/31/2019, order# [id number].Delos Haring

## 2019-09-10 ENCOUNTER — Telehealth: Payer: Self-pay | Admitting: Gerontology

## 2019-09-10 NOTE — Telephone Encounter (Signed)
Called patient on 10/20 in regards to scheduling appointment for ear ache. Left voicemail and asked patient to call back to schedule appointment.

## 2019-09-11 ENCOUNTER — Ambulatory Visit: Payer: Self-pay | Admitting: Gerontology

## 2019-09-11 ENCOUNTER — Encounter: Payer: Self-pay | Admitting: Gerontology

## 2019-09-11 ENCOUNTER — Other Ambulatory Visit: Payer: Self-pay

## 2019-09-11 VITALS — BP 132/92 | HR 98 | Ht 67.0 in | Wt 167.8 lb

## 2019-09-11 DIAGNOSIS — H6692 Otitis media, unspecified, left ear: Secondary | ICD-10-CM

## 2019-09-11 HISTORY — DX: Otitis media, unspecified, left ear: H66.92

## 2019-09-11 MED ORDER — NEOMYCIN-POLYMYXIN-HC 3.5-10000-1 OT SOLN: 4.0000 [drp] | Freq: Four times a day (QID) | OTIC | 0 refills | Status: DC

## 2019-09-11 NOTE — Patient Instructions (Signed)
Otitis Media, Adult  Otitis media means that the middle ear is red and swollen (inflamed) and full of fluid. The condition usually goes away on its own. Follow these instructions at home:  Take over-the-counter and prescription medicines only as told by your doctor.  If you were prescribed an antibiotic medicine, take it as told by your doctor. Do not stop taking the antibiotic even if you start to feel better.  Keep all follow-up visits as told by your doctor. This is important. Contact a doctor if:  You have bleeding from your nose.  There is a lump on your neck.  You are not getting better in 5 days.  You feel worse instead of better. Get help right away if:  You have pain that is not helped with medicine.  You have swelling, redness, or pain around your ear.  You get a stiff neck.  You cannot move part of your face (paralyzed).  You notice that the bone behind your ear hurts when you touch it.  You get a very bad headache. Summary  Otitis media means that the middle ear is red, swollen, and full of fluid.  This condition usually goes away on its own. In some cases, treatment may be needed.  If you were prescribed an antibiotic medicine, take it as told by your doctor. This information is not intended to replace advice given to you by your health care provider. Make sure you discuss any questions you have with your health care provider. Document Released: 04/25/2008 Document Revised: 10/20/2017 Document Reviewed: 11/28/2016 Elsevier Patient Education  2020 Elsevier Inc.  

## 2019-09-11 NOTE — Progress Notes (Signed)
Established Patient Office Visit  Subjective:  Patient ID: Cameron [name], male    DOB: [birth year]  Age: 48 y.o. MRN: [id number]  CC:  Chief Complaint  Patient presents with  . Ear Pain    left ear, pain into jaw, started about 1.5 weeks ago    HPI Cameron [name] presents for c/o Otalgia to left ear that started 2 weeks ago. He reports that he was seen at [facility] on 09/03/2019 and he finished a one week course of Augmentine  with no relief. He states that he experiences yellowish discharge from the left ear since taking the antibiotics and uses bulb suction to remove discharge from his ear. He states that the pain radiates to his left Temporo Mandibular Joint which affects his eating. He also experiences intermittent tinnitus and his equilibrium is mildly affected, but he denies vertigo , dizziness or fall. He denies fever, chills and loss of hearing. He has a history of seasonal allergy and taking Zyrtec and Flonase controls his symptoms. He reports that he's doing well but concerned about his left ear infection. He offers no further concerns.  Past Medical History:  Diagnosis Date  . Allergy   . Asthma   . Depression   . Substance abuse ([place])     No past surgical history on file.  Family History  Problem Relation Age of Onset  . Diabetes Father     Social History   Socioeconomic History  . Marital status: Single    Spouse name: Not on file  . Number of children: Not on file  . Years of education: Not on file  . Highest education level: Not on file  Occupational History  . Occupation: unemployed  Social Needs  . Financial resource strain: Not on file  . Food insecurity    Worry: Not on file    Inability: Not on file  . Transportation needs    Medical: Not on file    Non-medical: Not on file  Tobacco Use  . Smoking status: Never Smoker  . Smokeless tobacco: Current User    Types: Chew  Substance and Sexual Activity  . Alcohol use: Not  Currently  . Drug use: Never  . Sexual activity: Not on file  Lifestyle  . Physical activity    Days per week: Not on file    Minutes per session: Not on file  . Stress: Not on file  Relationships  . Social Herbalist on phone: Not on file    Gets together: Not on file    Attends religious service: Not on file    Active member of club or organization: Not on file    Attends meetings of clubs or organizations: Not on file    Relationship status: Not on file  . Intimate partner violence    Fear of current or ex partner: Not on file    Emotionally abused: Not on file    Physically abused: Not on file    Forced sexual activity: Not on file  Other Topics Concern  . Not on file  Social History Narrative  . Not on file    Outpatient Medications Prior to Visit  Medication Sig Dispense Refill  . albuterol (PROVENTIL HFA;VENTOLIN HFA) 108 (90 Base) MCG/ACT inhaler Inhale 2 puffs into the lungs every 4 (four) hours as needed for wheezing or shortness of breath. 1 Inhaler 2  . cetirizine (ZYRTEC) 10 MG tablet Take 10 mg  by mouth daily.    . fluticasone (FLONASE) 50 MCG/ACT nasal spray Place 2 sprays into both nostrils at bedtime. 16 g 2  . fluticasone (FLOVENT HFA) 110 MCG/ACT inhaler Inhale 1 puff into the lungs 2 (two) times daily. 1 Inhaler 12  . sertraline (ZOLOFT) 100 MG tablet TAKE ONE TABLET BY MOUTH AT BEDTIME 30 tablet 0  . ibuprofen (ADVIL,MOTRIN) 800 MG tablet Take 1 tablet (800 mg total) by mouth every 8 (eight) hours as needed for fever, headache, mild pain, moderate pain or cramping. (Patient not taking: Reported on 09/11/2019) 30 tablet 0  . predniSONE (STERAPRED UNI-PAK 21 TAB) 10 MG (21) TBPK tablet Prednisone Taper Use as directed 21 tablet 0   No facility-administered medications prior to visit.     No Known Allergies  ROS Review of Systems  Constitutional: Negative.   HENT: Positive for ear discharge, ear pain and tinnitus. Negative for hearing loss,  postnasal drip, rhinorrhea, sinus pressure, sinus pain, sore throat and trouble swallowing.   Respiratory: Negative.   Cardiovascular: Negative.   Skin: Negative.   Neurological: Negative.   Psychiatric/Behavioral: Negative.       Objective:    Physical Exam  Constitutional: He is oriented to person, place, and time. He appears well-developed and well-nourished.  HENT:  Left Ear: No swelling. A middle ear effusion is present. No decreased hearing is noted.  Eyes: Pupils are equal, round, and reactive to light. EOM are normal.  Cardiovascular: Normal rate and regular rhythm.  Pulmonary/Chest: Effort normal and breath sounds normal.  Neurological: He is alert and oriented to person, place, and time.  Skin: Skin is warm and dry.  Psychiatric: He has a normal mood and affect. His behavior is normal. Judgment and thought content normal.    BP (!) 132/92 (BP Location: Left Arm, Patient Position: Sitting)   Pulse 98   Ht 5\' 7"  (1.702 m)   Wt 167 lb 12.8 oz (76.1 kg)   SpO2 99%   BMI 26.28 kg/m  Wt Readings from Last 3 Encounters:  09/11/19 167 lb 12.8 oz (76.1 kg)  01/31/19 169 lb 9.6 oz (76.9 kg)  01/16/19 170 lb 14.4 oz (77.5 kg)     Health Maintenance Due  Topic Date Due  . HIV Screening  11/04/1986  . INFLUENZA VACCINE  06/22/2019  . TETANUS/TDAP  06/26/2019    There are no preventive care reminders to display for this patient.  Lab Results  Component Value Date   TSH 2.370 01/03/2019   Lab Results  Component Value Date   WBC 7.2 01/03/2019   HGB 15.7 01/03/2019   HCT 46.3 01/03/2019   MCV 90 01/03/2019   PLT 243 01/03/2019   Lab Results  Component Value Date   NA 140 01/03/2019   K 4.6 01/03/2019   CO2 25 01/03/2019   GLUCOSE 83 01/03/2019   BUN 9 01/03/2019   CREATININE 1.09 01/03/2019   BILITOT 0.4 01/03/2019   ALKPHOS 63 01/03/2019   AST 21 01/03/2019   ALT 16 01/03/2019   PROT 7.0 01/03/2019   ALBUMIN 4.6 01/03/2019   CALCIUM 9.5 01/03/2019    Lab Results  Component Value Date   CHOL 132 01/03/2019   Lab Results  Component Value Date   HDL 37 (L) 01/03/2019   Lab Results  Component Value Date   LDLCALC 74 01/03/2019   Lab Results  Component Value Date   TRIG 104 01/03/2019   Lab Results  Component Value Date   CHOLHDL  3.6 01/03/2019   Lab Results  Component Value Date   HGBA1C 5.4 01/03/2019      Assessment & Plan:   1. Left otitis media, unspecified otitis media type -He will continue on Cortisporin, educated on medication side effects. - neomycin-polymyxin-hydrocortisone (CORTISPORIN) OTIC solution; Place 4 drops into the left ear 4 (four) times daily.  Dispense: 10 mL; Refill: 0 - He was advised to take 650 mg Tylenol for left TMJ pain prior to eating. - He was advised not to use bulb suction to remove discharge from his left ear. - He was also advised to use Flonase as ordered.     Follow-up: Return in about 2 weeks (around 09/25/2019), or if symptoms worsen or fail to improve.    Kristyanna Barcelo Trellis Paganini, NP

## 2019-09-26 ENCOUNTER — Ambulatory Visit: Payer: Self-pay

## 2019-10-21 ENCOUNTER — Telehealth: Payer: Self-pay | Admitting: Pharmacist

## 2019-10-21 NOTE — Telephone Encounter (Signed)
10/21/2019 2:23:31 PM - Ventolin refill online with [facility]  10/21/2019 Placed refill online with [facility] for Ventolin HFA, to ship 11/01/2019, order# [id number].Delos Haring

## 2019-10-31 ENCOUNTER — Other Ambulatory Visit: Payer: Self-pay | Admitting: Gerontology

## 2019-11-20 ENCOUNTER — Telehealth: Payer: Self-pay | Admitting: Pharmacist

## 2019-11-20 NOTE — Telephone Encounter (Signed)
11/20/2019 10:09:31 AM - Scripts to provider to sign for refill  11/20/2019 Sending scripts to [place] to sign for refills-Flovent HFA 110 mcg Inhale 1 puff into the lungs 2 times a day, also Ventolin HFA 90 mcg Inhale 2 puffs into the lungs every 4 hours as needed for wheezing or shortness of breath. Delos Haring

## 2019-11-21 ENCOUNTER — Ambulatory Visit: Payer: Self-pay | Admitting: Gerontology

## 2019-11-21 ENCOUNTER — Encounter: Payer: Self-pay | Admitting: Gerontology

## 2019-11-21 ENCOUNTER — Other Ambulatory Visit: Payer: Self-pay

## 2019-11-21 VITALS — BP 111/69 | HR 96 | Ht 67.0 in | Wt 181.0 lb

## 2019-11-21 DIAGNOSIS — H9192 Unspecified hearing loss, left ear: Secondary | ICD-10-CM

## 2019-11-21 DIAGNOSIS — F1010 Alcohol abuse, uncomplicated: Secondary | ICD-10-CM

## 2019-11-21 DIAGNOSIS — Z8659 Personal history of other mental and behavioral disorders: Secondary | ICD-10-CM

## 2019-11-21 MED ORDER — SERTRALINE HCL 100 MG PO TABS: 100.0000 mg | ORAL_TABLET | Freq: Every day | ORAL | 0 refills | Status: DC

## 2019-11-21 NOTE — Patient Instructions (Signed)
Alcohol Abuse and Dependence Information, Adult Alcohol is a widely available drug. People drink alcohol in different amounts. People who drink alcohol very often and in large amounts often have problems during and after drinking. They may develop what is called an alcohol use disorder. There are two main types of alcohol use disorders:  Alcohol abuse. This is when you use alcohol too much or too often. You may use alcohol to make yourself feel happy or to reduce stress. You may have a hard time setting a limit on the amount you drink.  Alcohol dependence. This is when you use alcohol consistently for a period of time, and your body changes as a result. This can make it hard to stop drinking because you may start to feel sick or feel different when you do not use alcohol. These symptoms are known as withdrawal. How can alcohol abuse and dependence affect me? Alcohol abuse and dependence can have a negative effect on your life. Drinking too much can lead to addiction. You may feel like you need alcohol to function normally. You may drink alcohol before work in the morning, during the day, or as soon as you get home from work in the evening. These actions can result in:  Poor work performance.  Job loss.  Financial problems.  Car crashes or criminal charges from driving after drinking alcohol.  Problems in your relationships with friends and family.  Losing the trust and respect of coworkers, friends, and family. Drinking heavily over a long period of time can permanently damage your body and brain, and can cause lifelong health issues, such as:  Damage to your liver or pancreas.  Heart problems, high blood pressure, or stroke.  Certain cancers.  Decreased ability to fight infections.  Brain or nerve damage.  Depression.  Early (premature) death. If you are careless or you crave alcohol, it is easy to drink more than your body can handle (overdose). Alcohol overdose is a serious  situation that requires hospitalization. It may lead to permanent injuries or death. What can increase my risk?  Having a family history of alcohol abuse.  Having depression or other mental health conditions.  Beginning to drink at an early age.  Binge drinking often.  Experiencing trauma, stress, and an unstable home life during childhood.  Spending time with people who drink often. What actions can I take to prevent or manage alcohol abuse and dependence?  Do not drink alcohol if: ? Your health care provider tells you not to drink. ? You are pregnant, may be pregnant, or are planning to become pregnant.  If you drink alcohol: ? Limit how much you use to:  0-1 drink a day for women.  0-2 drinks a day for men. ? Be aware of how much alcohol is in your drink. In the [place] one drink equals one 12 oz bottle of beer (355 mL), one 5 oz glass of wine (148 mL), or one 1 oz glass of hard liquor (44 mL).  Stop drinking if you have been drinking too much. This can be very hard to do if you are used to abusing alcohol. If you begin to have withdrawal symptoms, talk with your health care provider or a person that you trust. These symptoms may include anxiety, shaky hands, headache, nausea, sweating, or not being able to sleep.  Choose to drink nonalcoholic beverages in social gatherings and places where there may be alcohol. Activity  Spend more time on activities that you enjoy that do   not involve alcohol, like hobbies or exercise.  Find healthy ways to cope with stress, such as exercise, meditation, or spending time with people you care about. General information  Talk to your family, coworkers, and friends about supporting you in your efforts to stop drinking. If they drink, ask them not to drink around you. Spend more time with people who do not drink alcohol.  If you think that you have an alcohol dependency problem: ? Tell friends or family about your concerns. ? Talk with your  health care provider or another health professional about where to get help. ? Work with a therapist and a chemical dependency counselor. ? Consider joining a support group for people who struggle with alcohol abuse and dependence. Where to find support   Your health care provider.  SMART Recovery: www.smartrecovery.org Therapy and support groups  Local treatment centers or chemical dependency counselors.  Local AA groups in your community: www.aa.org Where to find more information  Centers for Disease Control and Prevention: www.cdc.gov  National Institute on Alcohol Abuse and Alcoholism: www.niaaa.nih.gov  Alcoholics Anonymous (AA): www.aa.org Contact a health care provider if:  You drank more or for longer than you intended on more than one occasion.  You tried to stop drinking or to cut back on how much you drink, but you were not able to.  You often drink to the point of vomiting or passing out.  You want to drink so badly that you cannot think about anything else.  You have problems in your life due to drinking, but you continue to drink.  You keep drinking even though you feel anxious, depressed, or have experienced memory loss.  You have stopped doing the things you used to enjoy in order to drink.  You have to drink more than you used to in order to get the effect you want.  You experience anxiety, sweating, nausea, shakiness, and trouble sleeping when you try to stop drinking. Get help right away if:  You have thoughts about hurting yourself or others.  You have serious withdrawal symptoms, including: ? Confusion. ? Racing heart. ? High blood pressure. ? Fever. If you ever feel like you may hurt yourself or others, or have thoughts about taking your own life, get help right away. You can go to your nearest emergency department or call:  Your local emergency services (911 in the [place]  A suicide crisis helpline, such as the National Suicide Prevention  Lifeline at 1-800-273-8255. This is open 24 hours a day. Summary  Alcohol abuse and dependence can have a negative effect on your life. Drinking too much or too often can lead to addiction.  If you drink alcohol, limit how much you use.  If you are having trouble keeping your drinking under control, find ways to change your behavior. Hobbies, calming activities, exercise, or support groups can help.  If you feel you need help with changing your drinking habits, talk with your health care provider, a good friend, or a therapist, or go to an AA group. This information is not intended to replace advice given to you by your health care provider. Make sure you discuss any questions you have with your health care provider. Document Revised: 02/26/2019 Document Reviewed: 01/15/2019 Elsevier Patient Education  2020 Elsevier Inc.  

## 2019-11-21 NOTE — Progress Notes (Signed)
Established Patient Office Visit  Subjective:  Patient ID: Cameron [name], male    DOB: 07/23/1971  Age: 48 y.o. MRN: [id number]  CC:  Chief Complaint  Patient presents with  . Ear Drainage    left ear    HPI Cameron [name] presents for follow up of recurrent left ear drainage.He was previously treated for left otitis media with oral and ottic antibiotics in October of 2020. He states that currently,  he experiences intermittent tinnitus, and some hearing loss to his left ear which slightly affects his balance. He states that he observes clear odorous drainage on his pillow when he wakes up.  He denies mastoid pain, sinus pressure and otalgia. He also states that he gets tired easily and requests his labs to be checked. He was at [place] for substance abuse rehabilitation in February of 2020, but he left the program. Currently he admits to drinking six cans of 12 ounce beer daily. He denies withdrawal symptoms and declines to talk to Ms Moshe Cipro for mental health evaluation. He also takes 100 mg sertraline for anxiety, he states that his mood is good and denies suicidal nor homicidal ideation. He states that he's doing well and offers no further complaint.  Past Medical History:  Diagnosis Date  . Allergy   . Asthma   . Depression   . Substance abuse ([place])     No past surgical history on file.  Family History  Problem Relation Age of Onset  . Diabetes Father     Social History   Socioeconomic History  . Marital status: Single    Spouse name: Not on file  . Number of children: Not on file  . Years of education: Not on file  . Highest education level: Not on file  Occupational History  . Occupation: unemployed  Tobacco Use  . Smoking status: Never Smoker  . Smokeless tobacco: Current User    Types: Chew  Substance and Sexual Activity  . Alcohol use: Not Currently  . Drug use: Never  . Sexual activity: Not on file  Other Topics Concern  . Not on file  Social  History Narrative  . Not on file   Social Determinants of Health   Financial Resource Strain:   . Difficulty of Paying Living Expenses: Not on file  Food Insecurity:   . Worried About Charity fundraiser in the Last Year: Not on file  . Ran Out of Food in the Last Year: Not on file  Transportation Needs:   . Lack of Transportation (Medical): Not on file  . Lack of Transportation (Non-Medical): Not on file  Physical Activity:   . Days of Exercise per Week: Not on file  . Minutes of Exercise per Session: Not on file  Stress:   . Feeling of Stress : Not on file  Social Connections:   . Frequency of Communication with Friends and Family: Not on file  . Frequency of Social Gatherings with Friends and Family: Not on file  . Attends Religious Services: Not on file  . Active Member of Clubs or Organizations: Not on file  . Attends Archivist Meetings: Not on file  . Marital Status: Not on file  Intimate Partner Violence:   . Fear of Current or Ex-Partner: Not on file  . Emotionally Abused: Not on file  . Physically Abused: Not on file  . Sexually Abused: Not on file    Outpatient Medications Prior to Visit  Medication  Sig Dispense Refill  . albuterol (PROVENTIL HFA;VENTOLIN HFA) 108 (90 Base) MCG/ACT inhaler Inhale 2 puffs into the lungs every 4 (four) hours as needed for wheezing or shortness of breath. 1 Inhaler 2  . cetirizine (ZYRTEC) 10 MG tablet TAKE ONE TABLET BY MOUTH AS NEEDED FOR ALLERGIES 60 tablet 0  . fluticasone (FLONASE) 50 MCG/ACT nasal spray PLACE 2 SPRAYS INT BOTH NOSTRILS AT BEDTIME 16 g 0  . fluticasone (FLOVENT HFA) 110 MCG/ACT inhaler Inhale 1 puff into the lungs 2 (two) times daily. 1 Inhaler 12  . sertraline (ZOLOFT) 100 MG tablet TAKE ONE TABLET BY MOUTH AT BEDTIME 30 tablet 0  . ibuprofen (ADVIL,MOTRIN) 800 MG tablet Take 1 tablet (800 mg total) by mouth every 8 (eight) hours as needed for fever, headache, mild pain, moderate pain or cramping.  (Patient not taking: Reported on 09/11/2019) 30 tablet 0  . neomycin-polymyxin-hydrocortisone (CORTISPORIN) OTIC solution Place 4 drops into the left ear 4 (four) times daily. (Patient not taking: Reported on 11/21/2019) 10 mL 0   No facility-administered medications prior to visit.    No Known Allergies  ROS Review of Systems  Constitutional: Positive for fatigue.  HENT: Positive for ear discharge, hearing loss and tinnitus. Negative for sinus pressure and sinus pain.   Respiratory: Negative.   Cardiovascular: Negative.   Neurological: Negative.   Psychiatric/Behavioral: Negative.       Objective:    Physical Exam  Constitutional: He is oriented to person, place, and time. He appears well-developed.  HENT:  Head: Normocephalic.  Right Ear: Hearing, external ear and ear canal normal. No drainage. No decreased hearing is noted.  Left Ear: No drainage or tenderness. No mastoid tenderness. Tympanic membrane is perforated.  No middle ear effusion. Decreased hearing is noted.  Cardiovascular: Normal rate and regular rhythm.  Pulmonary/Chest: Effort normal and breath sounds normal.  Neurological: He is alert and oriented to person, place, and time.  Psychiatric: He has a normal mood and affect. His behavior is normal. Judgment and thought content normal.    BP 111/69 (BP Location: Left Arm, Patient Position: Sitting)   Pulse 96   Ht 5' 7"  (1.702 m)   Wt 181 lb (82.1 kg)   SpO2 93%   BMI 28.35 kg/m  Wt Readings from Last 3 Encounters:  11/21/19 181 lb (82.1 kg)  09/11/19 167 lb 12.8 oz (76.1 kg)  01/31/19 169 lb 9.6 oz (76.9 kg)     Health Maintenance Due  Topic Date Due  . HIV Screening  11/04/1986  . INFLUENZA VACCINE  06/22/2019  . TETANUS/TDAP  06/26/2019    There are no preventive care reminders to display for this patient.  Lab Results  Component Value Date   TSH 2.370 01/03/2019   Lab Results  Component Value Date   WBC 7.2 01/03/2019   HGB 15.7  01/03/2019   HCT 46.3 01/03/2019   MCV 90 01/03/2019   PLT 243 01/03/2019   Lab Results  Component Value Date   NA 140 01/03/2019   K 4.6 01/03/2019   CO2 25 01/03/2019   GLUCOSE 83 01/03/2019   BUN 9 01/03/2019   CREATININE 1.09 01/03/2019   BILITOT 0.4 01/03/2019   ALKPHOS 63 01/03/2019   AST 21 01/03/2019   ALT 16 01/03/2019   PROT 7.0 01/03/2019   ALBUMIN 4.6 01/03/2019   CALCIUM 9.5 01/03/2019   Lab Results  Component Value Date   CHOL 132 01/03/2019   Lab Results  Component Value Date  HDL 37 (L) 01/03/2019   Lab Results  Component Value Date   LDLCALC 74 01/03/2019   Lab Results  Component Value Date   TRIG 104 01/03/2019   Lab Results  Component Value Date   CHOLHDL 3.6 01/03/2019   Lab Results  Component Value Date   HGBA1C 5.4 01/03/2019      Assessment & Plan:    1. Alcohol abuse - He was strongly advised on alcohol abstinence, will recheck routine labs to evaluate symptoms of fatigue. - Vitamin B1; Future - B12 and Folate Panel; Future - Comp Met (CMET); Future - CBC w/Diff; Future - Lipid panel; Future - Magnesium; Future  2. History of anxiety - He will follow up with Ms Moshe Cipro for mental health evaluation. He was advised to call the Crisis help line for worsening symptoms. - sertraline (ZOLOFT) 100 MG tablet; Take 1 tablet (100 mg total) by mouth at bedtime.  Dispense: 30 tablet; Refill: 0  3. Hearing loss of left ear, unspecified hearing loss type - He has positive decrease hearing loss to left ear, also possible left tympanic membrane perforation probably from using bulb suction to remove wax from his ear, and no landmark was seen. He was strongly encouraged to complete [facility] charity care application  for- Ambulatory referral to ENT;. Will call ENT clinic beginning of next week to expedite referral. He was advised to go to the ED for worsening symptoms.     Follow-up: Return in about 5 weeks (around 12/26/2019), or if symptoms worsen  or fail to improve.    Airon Sahni Jerold Coombe, NP

## 2019-11-26 ENCOUNTER — Telehealth: Payer: Self-pay | Admitting: Pharmacist

## 2019-11-26 NOTE — Telephone Encounter (Signed)
11/26/2019 9:33:48 AM - Flovent HFA & Ventolin HFA scripts to [facility]  11/26/2019 Faxed scripts to [facility] for refill Flovent HFA & Ventolin HFA 47mcg--patient enrolled with [facility] till 02/03/2020.Forde Radon

## 2019-12-04 ENCOUNTER — Ambulatory Visit: Payer: Self-pay | Admitting: Gerontology

## 2019-12-04 ENCOUNTER — Encounter: Payer: Self-pay | Admitting: Emergency Medicine

## 2019-12-04 ENCOUNTER — Emergency Department: Payer: Self-pay

## 2019-12-04 ENCOUNTER — Encounter: Payer: Self-pay | Admitting: Gerontology

## 2019-12-04 ENCOUNTER — Emergency Department
Admission: EM | Admit: 2019-12-04 | Discharge: 2019-12-04 | Disposition: A | Discharge status: Home / Self Care | Payer: Self-pay | Attending: Emergency Medicine | Admitting: Emergency Medicine

## 2019-12-04 ENCOUNTER — Other Ambulatory Visit: Payer: Self-pay

## 2019-12-04 DIAGNOSIS — Z79899 Other long term (current) drug therapy: Secondary | ICD-10-CM | POA: Insufficient documentation

## 2019-12-04 DIAGNOSIS — Y999 Unspecified external cause status: Secondary | ICD-10-CM | POA: Insufficient documentation

## 2019-12-04 DIAGNOSIS — J45909 Unspecified asthma, uncomplicated: Secondary | ICD-10-CM | POA: Insufficient documentation

## 2019-12-04 DIAGNOSIS — S99921A Unspecified injury of right foot, initial encounter: Secondary | ICD-10-CM

## 2019-12-04 DIAGNOSIS — Y9289 Other specified places as the place of occurrence of the external cause: Secondary | ICD-10-CM | POA: Insufficient documentation

## 2019-12-04 DIAGNOSIS — S82431A Displaced oblique fracture of shaft of right fibula, initial encounter for closed fracture: Secondary | ICD-10-CM | POA: Insufficient documentation

## 2019-12-04 DIAGNOSIS — S99911A Unspecified injury of right ankle, initial encounter: Secondary | ICD-10-CM

## 2019-12-04 DIAGNOSIS — W172XXA Fall into hole, initial encounter: Secondary | ICD-10-CM | POA: Insufficient documentation

## 2019-12-04 DIAGNOSIS — Y9389 Activity, other specified: Secondary | ICD-10-CM | POA: Insufficient documentation

## 2019-12-04 MED ORDER — IBUPROFEN 600 MG PO TABS: 600.0000 mg | ORAL_TABLET | Freq: Three times a day (TID) | ORAL | 0 refills | Status: DC | PRN

## 2019-12-04 MED ORDER — OXYCODONE-ACETAMINOPHEN 7.5-325 MG PO TABS: 1.0000 | ORAL_TABLET | Freq: Four times a day (QID) | ORAL | 0 refills | Status: DC | PRN

## 2019-12-04 MED ORDER — IBUPROFEN 600 MG PO TABS
600.0000 mg | ORAL_TABLET | Freq: Once | ORAL | Status: AC
  Administered 2019-12-04: 600 mg via ORAL
  Filled 2019-12-04: qty 1

## 2019-12-04 MED ORDER — OXYCODONE-ACETAMINOPHEN 5-325 MG PO TABS
1.0000 | ORAL_TABLET | Freq: Once | ORAL | Status: AC
  Administered 2019-12-04: 1 via ORAL
  Filled 2019-12-04: qty 1

## 2019-12-04 NOTE — ED Triage Notes (Signed)
Rolled right ankle on Monday.  C/O pain to ankle.

## 2019-12-04 NOTE — ED Provider Notes (Signed)
[facility] Emergency Department Provider Note   ____________________________________________   First MD Initiated Contact with Patient 12/04/19 1730     (approximate)  I have reviewed the triage vital signs and the nursing notes.   HISTORY  Chief Complaint Ankle Pain    HPI Cameron [name] is a 49 y.o. male patient complain of right ankle pain for 2 days.  Patient is able to his ankle 2 days ago.  There is no pain, edema, and ecchymosis.  Patient state unable to weight-bear secondary to pain.  Rates pain as a 7/10.  Described pain as "achy".  No relief with over-the-counter anti-inflammatory medications.         Past Medical History:  Diagnosis Date  . Allergy   . Asthma   . Depression   . Substance abuse [facility])     Patient Active Problem List   Diagnosis Date Noted  . Injury of ankle and foot, right, initial encounter 12/04/2019  . History of anxiety 11/21/2019  . Hearing loss in left ear 11/21/2019  . Otitis media of left ear 09/11/2019  . Fatigue 05/02/2019  . Joint pain 05/02/2019  . Asthma due to environmental allergies 01/03/2019  . Depression, recurrent ([place]) 01/03/2019  . Alcohol abuse 01/03/2019  . Substance abuse ([facility])-Alcohol abuse 01/03/2019    History reviewed. No pertinent surgical history.  Prior to Admission medications   Medication Sig Start Date End Date Taking? Authorizing Provider  albuterol (PROVENTIL HFA;VENTOLIN HFA) 108 (90 Base) MCG/ACT inhaler Inhale 2 puffs into the lungs every 4 (four) hours as needed for wheezing or shortness of breath. 01/03/19   Tukov-Yual, Arlyss Gandy, NP  cetirizine (ZYRTEC) 10 MG tablet TAKE ONE TABLET BY MOUTH AS NEEDED FOR ALLERGIES 10/31/19   Tukov-Yual, Arlyss Gandy, NP  fluticasone (FLONASE) 50 MCG/ACT nasal spray PLACE 2 SPRAYS INT BOTH NOSTRILS AT BEDTIME 10/31/19   Tukov-Yual, Magdalene S, NP  fluticasone (FLOVENT HFA) 110 MCG/ACT inhaler Inhale 1 puff into the lungs 2 (two)  times daily. 01/03/19   Tukov-Yual, Arlyss Gandy, NP  ibuprofen (ADVIL) 600 MG tablet Take 1 tablet (600 mg total) by mouth every 8 (eight) hours as needed. 12/04/19   Sable Feil, PA-C  oxyCODONE-acetaminophen (PERCOCET) 7.5-325 MG tablet Take 1 tablet by mouth every 6 (six) hours as needed. 12/04/19   Sable Feil, PA-C  sertraline (ZOLOFT) 100 MG tablet Take 1 tablet (100 mg total) by mouth at bedtime. 12/04/19   Iloabachie, Chioma E, NP    Allergies Patient has no known allergies.  Family History  Problem Relation Age of Onset  . Diabetes Father     Social History Social History   Tobacco Use  . Smoking status: Never Smoker  . Smokeless tobacco: Current User    Types: Chew  Substance Use Topics  . Alcohol use: Not Currently  . Drug use: Never    Review of Systems Constitutional: No fever/chills Eyes: No visual changes. ENT: No sore throat. Cardiovascular: Denies chest pain. Respiratory: Denies shortness of breath. Gastrointestinal: No abdominal pain.  No nausea, no vomiting.  No diarrhea.  No constipation. Genitourinary: Negative for dysuria. Musculoskeletal: Right lateral ankle pain.. Skin: Negative for rash. Neurological: Negative for headaches, focal weakness or numbness. Psychiatric:  Depression and substance abuse.  ____________________________________________   PHYSICAL EXAM:  VITAL SIGNS: ED Triage Vitals  Enc Vitals Group     BP 12/04/19 1708 118/85     Pulse Rate 12/04/19 1708 (!) 112  Resp 12/04/19 1708 16     Temp 12/04/19 1708 99.5 F (37.5 C)     Temp Source 12/04/19 1708 Oral     SpO2 12/04/19 1708 97 %     Weight 12/04/19 1706 179 lb 14.3 oz (81.6 kg)     Height 12/04/19 1706 5\' 7"  (1.702 m)     Head Circumference --      Peak Flow --      Pain Score 12/04/19 1706 7     Pain Loc --      Pain Edu? --      Excl. in GC? --    Constitutional: Alert and oriented. Well appearing and in no acute distress. Cardiovascular: Normal rate,  regular rhythm. Grossly normal heart sounds.  Good peripheral circulation. Respiratory: Normal respiratory effort.  No retractions. Lungs CTAB. Musculoskeletal: Obvious edema to the right lateral ankle.  Moderate guarding palpation.   Neurologic:  Normal speech and language. No gross focal neurologic deficits are appreciated. No gait instability. Skin:  Skin is warm, dry and intact. No rash noted.  Ecchymosis distal lateral malleolus. Psychiatric: Mood and affect are normal. Speech and behavior are normal.  ____________________________________________   LABS (all labs ordered are listed, but only abnormal results are displayed)  Labs Reviewed - No data to display ____________________________________________  EKG   ____________________________________________  RADIOLOGY  ED MD interpretation:    Official radiology report(s): DG Ankle Complete Right  Result Date: 12/04/2019 CLINICAL DATA:  Twisted ankle falling into pothole EXAM: RIGHT ANKLE - COMPLETE 3+ VIEW COMPARISON:  None. FINDINGS: Obliquely oriented, mildly comminuted fracture trans syndesmotic fracture of the distal fibula (Weber B). Additional osseous defect at the tip of the medial malleolus may reflect additional avulsion. There is slight lateral talar shift. Circumferential soft tissue swelling of the ankle is present with a moderate effusion. IMPRESSION: 1. Obliquely oriented, mildly comminuted trans syndesmotic fracture of the distal fibula with slight lateral talar shift. 2. Osseous defect at the tip of the medial malleolus may reflect additional avulsion. 3. Combination of features are compatible with a Weber B stage IV supination external rotation injury. 4. Associated swelling and ankle effusion. Electronically Signed   By: 12/06/2019 M.D.   On: 12/04/2019 17:52    ____________________________________________   PROCEDURES  Procedure(s) performed (including Critical Care):  .Splint Application  Date/Time:  12/04/2019 5:54 PM Performed by: 12/06/2019, PA-C Authorized by: Joni Reining, PA-C   Consent:    Consent obtained:  Verbal   Consent given by:  Patient   Risks discussed:  Discoloration, pain, swelling and numbness Pre-procedure details:    Sensation:  Normal Procedure details:    Laterality:  Right   Location:  Ankle   Ankle:  R ankle   Cast type:  Short leg   Splint type:  Sugar tong   Supplies:  Ortho-Glass Post-procedure details:    Pain:  Unchanged   Patient tolerance of procedure:  Tolerated well, no immediate complications     ____________________________________________   INITIAL IMPRESSION / ASSESSMENT AND PLAN / ED COURSE  As part of my medical decision making, I reviewed the following data within the electronic MEDICAL RECORD NUMBER     Patient presents with chronic pain secondary to a fracture of the distal fibula.  Patient placed in a splint and given crutches assist ambulation.  Patient advised to follow-up orthopedics by calling the clinic in the morning to schedule appointment.  Take medication as directed.    Joni Reining  Dorko [name] was evaluated in Emergency Department on 12/04/2019 for the symptoms described in the history of present illness. He was evaluated in the context of the global COVID-19 pandemic, which necessitated consideration that the patient might be at risk for infection with the SARS-CoV-2 virus that causes COVID-19. Institutional protocols and algorithms that pertain to the evaluation of patients at risk for COVID-19 are in a state of rapid change based on information released by regulatory bodies including the CDC and federal and state organizations. These policies and algorithms were followed during the patient's care in the ED.       ____________________________________________   FINAL CLINICAL IMPRESSION(S) / ED DIAGNOSES  Final diagnoses:  Closed displaced oblique fracture of shaft of right fibula, initial encounter     ED  Discharge Orders         Ordered    oxyCODONE-acetaminophen (PERCOCET) 7.5-325 MG tablet  Every 6 hours PRN     12/04/19 1759    ibuprofen (ADVIL) 600 MG tablet  Every 8 hours PRN     12/04/19 1759           Note:  This document was prepared using Dragon voice recognition software and may include unintentional dictation errors.    Joni Reining, PA-C 12/04/19 1814    Sharyn Creamer, MD 12/04/19 1901

## 2019-12-04 NOTE — Discharge Instructions (Signed)
Wear splint and ambulate with crutches and evaluation by orthopedics. °
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
## 2019-12-05 ENCOUNTER — Other Ambulatory Visit: Payer: Self-pay | Admitting: Gerontology

## 2019-12-05 ENCOUNTER — Ambulatory Visit: Payer: Self-pay | Admitting: Licensed Clinical Social Worker

## 2019-12-05 DIAGNOSIS — Z1330 Encounter for screening examination for mental health and behavioral disorders, unspecified: Secondary | ICD-10-CM

## 2019-12-05 DIAGNOSIS — Z8659 Personal history of other mental and behavioral disorders: Secondary | ICD-10-CM

## 2019-12-05 NOTE — BH Specialist Note (Signed)
Patient had some confusion about the reason for the clinician's call. Assessment was started but not finished. Clinician explained role, benefits and risks of confidentiality. Assessment was rescheduled and patient was agreeable to the plans.

## 2019-12-16 ENCOUNTER — Telehealth: Payer: Self-pay | Admitting: Pharmacist

## 2019-12-16 NOTE — Telephone Encounter (Signed)
12/16/2019 10:14:37 AM - [employer] form to patient for renewal  12/16/2019 Mailing patient his portion of [employer] renewal application to sign & return, also need current household income/support, taxes/4506T to be returned in order to renew for Ventolin & Flovent.Forde Radon

## 2019-12-17 ENCOUNTER — Ambulatory Visit: Payer: Self-pay | Admitting: Licensed Clinical Social Worker

## 2019-12-17 ENCOUNTER — Other Ambulatory Visit: Payer: Self-pay

## 2019-12-17 DIAGNOSIS — F1021 Alcohol dependence, in remission: Secondary | ICD-10-CM

## 2019-12-17 DIAGNOSIS — F330 Major depressive disorder, recurrent, mild: Secondary | ICD-10-CM

## 2019-12-17 DIAGNOSIS — F418 Other specified anxiety disorders: Secondary | ICD-10-CM
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
## 2019-12-18 ENCOUNTER — Other Ambulatory Visit: Payer: Self-pay

## 2019-12-24 ENCOUNTER — Other Ambulatory Visit: Payer: Self-pay

## 2019-12-24 MED ORDER — TRAZODONE HCL 50 MG PO TABS: 50.0000 mg | ORAL_TABLET | Freq: Every day | ORAL | 0 refills | Status: DC

## 2019-12-24 NOTE — Progress Notes (Signed)
tra

## 2019-12-26 ENCOUNTER — Ambulatory Visit: Payer: Self-pay | Admitting: Gerontology

## 2020-01-07 ENCOUNTER — Ambulatory Visit: Payer: Self-pay | Admitting: Licensed Clinical Social Worker

## 2020-01-07 ENCOUNTER — Other Ambulatory Visit: Payer: Self-pay

## 2020-01-07 DIAGNOSIS — F1021 Alcohol dependence, in remission: Secondary | ICD-10-CM

## 2020-01-07 DIAGNOSIS — F330 Major depressive disorder, recurrent, mild: Secondary | ICD-10-CM

## 2020-01-07 DIAGNOSIS — F418 Other specified anxiety disorders: Secondary | ICD-10-CM

## 2020-01-07 NOTE — BH Specialist Note (Signed)
Integrated Behavioral Health Follow Up Visit Via Phone  MRN: [id number] Name: Cameron [name]  Number of Integrated Behavioral Health Clinician visits: 1/6  Type of Service: Integrated Behavioral Health- Individual/Family Interpretor:No. Interpretor Name and Language: not applicable.   SUBJECTIVE: Cameron [name] is a 49 y.o. male accompanied by himself. Patient was referred by [name] NP for mental health. Patient reports the following symptoms/concerns: He explains that he is getting cabin fever with not being able to go many places due to a broken foot and the weather. He notes that the Trazodone is helping him sleep at night. He explains that some nights that he is restless and cannot sleep but thinks its more due to not burning off enough energy during the day. 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 He notes that he is half way through with his foot and leg issue so is hoping to find a job. He denies suicidal and homicidal thoughts.  Duration of problem: ; Severity of problem: mild  OBJECTIVE: Mood: Euthymic and Affect: Appropriate Risk of harm to self or others: No plan to harm self or others  LIFE CONTEXT: Family and Social: see above.  School/Work: see above. Self-Care: see above. Life Changes: see above.  GOALS ADDRESSED: Patient will: 1.  Reduce symptoms of: insomnia  2.  Increase knowledge and/or ability of: self-management skills  3.  Demonstrate ability to: Increase healthy adjustment to current life circumstances  INTERVENTIONS: Interventions utilized:  Supportive Counseling was utilized by the clinician during today's follow up session. Clinician processed with the patient regarding how he has been doing since the last follow up session. Clinician asked the patient if he has noticed a difference in symptoms of insomnia since  he started taking the Trazodone 50 mg at bedtime. Clinician explained to the patient that it sounds like he is still anxious due to to situational stressors with his broken foot but it sounds like he has been able to relax and get some much needed rest.  Standardized Assessments completed: GAD-7 and PHQ 9  ASSESSMENT: Patient currently experiencing see above.   Patient may benefit from see above.  PLAN: 1. Follow up with behavioral health clinician on : three weeks or earlier if needed. 2. Behavioral recommendations: see above. 3. Referral(s): Integrated [facility] (In Clinic) 4. "From scale of 1-10, how likely are you to follow plan?":   Althia Forts, LCSW

## 2020-01-13 ENCOUNTER — Other Ambulatory Visit: Payer: Self-pay

## 2020-01-13 DIAGNOSIS — Z8659 Personal history of other mental and behavioral disorders: Secondary | ICD-10-CM

## 2020-01-13 MED ORDER — SERTRALINE HCL 100 MG PO TABS: 100.0000 mg | ORAL_TABLET | Freq: Every day | ORAL | 2 refills | Status: DC

## 2020-01-28 ENCOUNTER — Ambulatory Visit: Payer: Self-pay | Admitting: Licensed Clinical Social Worker

## 2020-01-28 ENCOUNTER — Other Ambulatory Visit: Payer: Self-pay

## 2020-01-28 DIAGNOSIS — F418 Other specified anxiety disorders: Secondary | ICD-10-CM

## 2020-01-28 DIAGNOSIS — F330 Major depressive disorder, recurrent, mild: Secondary | ICD-10-CM

## 2020-01-28 MED ORDER — TRAZODONE HCL 50 MG PO TABS: 50.0000 mg | ORAL_TABLET | Freq: Every day | ORAL | 1 refills | Status: DC

## 2020-01-28 NOTE — BH Specialist Note (Signed)
Integrated Behavioral Health Follow Up Visit Via Phone  MRN: [id number] Name: EMERALD GEHRES [name]  Number of Integrated Behavioral Health Clinician visits: 2/6  Type of Service: Integrated Behavioral Health- Individual/Family Interpretor:No. Interpretor Name and Language: Not applicable.   SUBJECTIVE: Cameron [name] is a 49 y.o. male accompanied by himself. Patient was referred by [name] NP for mental health. Patient reports the following symptoms/concerns: He notes that he is out of a cast and is now wearing a boot. He explains that its boosted his morale a little bit and feels like he is another step closer to being able to work again. He asked about charity care and has filed his taxes recently but does not have a printer to print them out. He asked about how the dental application process works. He notes that he is having a lot of trouble sleeping lately despite still taking the Trazodone because not being able to lay a certain way with his foot. She denies suicidal and homicidal thoughts.  Duration of problem: ; Severity of problem: mild  OBJECTIVE: Mood: Euthymic and Affect: Appropriate Risk of harm to self or others: No plan to harm self or others  LIFE CONTEXT: Family and Social: see above. School/Work: see above. Self-Care: see above. Life Changes: see above.   GOALS ADDRESSED: Patient will: 1.  Reduce symptoms of: insomnia  2.  Increase knowledge and/or ability of: self-management skills and stress reduction  3.  Demonstrate ability to: Increase healthy adjustment to current life circumstances  INTERVENTIONS: Interventions utilized:  Supportive Counseling was utilized by the clinician during today's follow up session. Clinician processed with the patient regarding how he has been doing since the last follow up session. Clinician explained to the patient that she believes he can bring in a print out showing that he filed his taxes for charity care. Clinician  explained to the patient how the dental application process works. Clinician measured the patient's anxiety and depression on a numerical scale. Clinician encouraged the patient to take the time to heal up his foot. Clinician explained to the patient that he can most likely take off his boot at bed time and just needs to wear it when he is mobile. Clinician recommended reaching out to the provider overseeing his care to check.  Standardized Assessments completed: GAD-7 and PHQ 9  ASSESSMENT: Patient currently experiencing see above.   Patient may benefit from see above.  PLAN: 1. Follow up with behavioral health clinician on : three weeks or earlier if needed. 2. Behavioral recommendations: see above. 3. Referral(s): Integrated [facility] (In Clinic) 4. "From scale of 1-10, how likely are you to follow plan?":   Althia Forts, LCSW

## 2020-02-18 ENCOUNTER — Other Ambulatory Visit: Payer: Self-pay

## 2020-02-18 ENCOUNTER — Ambulatory Visit: Payer: Self-pay | Admitting: Licensed Clinical Social Worker

## 2020-02-18 ENCOUNTER — Encounter: Payer: Self-pay | Admitting: Licensed Clinical Social Worker

## 2020-02-18 DIAGNOSIS — F330 Major depressive disorder, recurrent, mild: Secondary | ICD-10-CM

## 2020-02-18 DIAGNOSIS — F418 Other specified anxiety disorders: Secondary | ICD-10-CM

## 2020-02-18 NOTE — BH Specialist Note (Signed)
Integrated Behavioral Health Follow Up Visit Via Phone  MRN: [id number] Name: Cameron Cameron [name]  Number of Integrated Behavioral Health Clinician visits: 3/6  Type of Service: Integrated Behavioral Health- Individual/Family Interpretor:No. Interpretor Name and Language: not applicable.   SUBJECTIVE: Cameron Cameron [name] Cameron [name] is a 49 y.o. male accompanied by himself. Patient was referred by [name] NP for mental health. Patient reports the following symptoms/concerns: He notes that he is able to move around a little bit and goes back tot he doctor on Thursday. He notes that he has been able to put some weight on his foot and has used a cane some when needed. He explains that he is worried that he is a little nervous that he may have messed up his foot. He notes that he needs to get back to work so he is hoping he is cleared to be able to do so. He notes that the Trazodone helps him to sleep but its a restless sleep. He notes that he is thinking about getting the COVID 19 vaccine. He denies suicidal and homicidal thoughts.  Duration of problem: ; Severity of problem: mild  OBJECTIVE: Mood: Euthymic and Affect: Appropriate Risk of harm to self or others: No plan to harm self or others  LIFE CONTEXT: Family and Social: see above. School/Work: see above. Self-Care: see above. Life Changes: see above.   GOALS ADDRESSED: Patient will: 1.  Reduce symptoms of: anxiety  2.  Increase knowledge and/or ability of: self-management skills and stress reduction  3.  Demonstrate ability to: Increase healthy adjustment to current life circumstances  INTERVENTIONS: Interventions utilized:  Supportive Counseling was utilized by the clinician during today's follow up session. Clinician processed with the patient regarding how he has been doing since the last follow up session. Clinician measured the patient 's anxiety and depression on a numerical scale. Clinician encouraged the patient to focus on  the positive rather than negative in his life. Clinician encouraged the patient to practice self care and take his psychotropic medication as prescribed.  Standardized Assessments completed: GAD-7 and PHQ 9 social determinants screening completed.   ASSESSMENT: Patient currently experiencing see above.   Patient may benefit from see above.  PLAN: 1. Follow up with behavioral health clinician on : three weeks or earlier if needed.  2. Behavioral recommendations: see above. 3. Referral(s): Integrated [facility] (In Clinic) 4. "From scale of 1-10, how likely are you to follow plan?":   Althia Forts, LCSW

## 2020-03-10 ENCOUNTER — Ambulatory Visit: Payer: Self-pay | Admitting: Licensed Clinical Social Worker

## 2020-03-19 ENCOUNTER — Ambulatory Visit: Payer: Self-pay | Admitting: Licensed Clinical Social Worker

## 2020-03-19 ENCOUNTER — Other Ambulatory Visit: Payer: Self-pay

## 2020-03-19 DIAGNOSIS — F330 Major depressive disorder, recurrent, mild: Secondary | ICD-10-CM

## 2020-03-19 DIAGNOSIS — F418 Other specified anxiety disorders: Secondary | ICD-10-CM
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
## 2020-03-20 ENCOUNTER — Ambulatory Visit: Payer: Self-pay | Attending: Internal Medicine

## 2020-03-20 DIAGNOSIS — Z23 Encounter for immunization: Secondary | ICD-10-CM

## 2020-03-20 NOTE — Progress Notes (Signed)
   Covid-19 Vaccination Clinic  Name:  Cameron [name]    MRN: [id number] DOB: 1971-10-04  03/20/2020  Mr. [name] was observed post Covid-19 immunization for 15 minutes without incident. He was provided with Vaccine Information Sheet and instruction to access the V-Safe system.   Mr. [name] was instructed to call 911 with any severe reactions post vaccine: Marland Kitchen Difficulty breathing  . Swelling of face and throat  . A fast heartbeat  . A bad rash all over body  . Dizziness and weakness   Immunizations Administered    Name Date Dose VIS Date Route   Pfizer COVID-19 Vaccine 03/20/2020 10:37 AM 0.3 mL 01/15/2019 Intramuscular   Manufacturer: [employer], [employer]   Lot: [id number]   NDC: [address]-4

## 2020-03-24 ENCOUNTER — Ambulatory Visit: Payer: Self-pay | Admitting: Licensed Clinical Social Worker

## 2020-04-02 ENCOUNTER — Ambulatory Visit: Payer: Self-pay | Admitting: Licensed Clinical Social Worker

## 2020-04-09 ENCOUNTER — Ambulatory Visit: Payer: Self-pay | Admitting: Licensed Clinical Social Worker

## 2020-04-09 ENCOUNTER — Other Ambulatory Visit: Payer: Self-pay

## 2020-04-09 DIAGNOSIS — F330 Major depressive disorder, recurrent, mild: Secondary | ICD-10-CM

## 2020-04-09 DIAGNOSIS — F1021 Alcohol dependence, in remission: Secondary | ICD-10-CM

## 2020-04-09 DIAGNOSIS — F418 Other specified anxiety disorders: Secondary | ICD-10-CM

## 2020-04-09 NOTE — BH Specialist Note (Signed)
Integrated Behavioral Health Follow Up Visit via Phone  MRN: [id number] Name: Cameron [name]  Type of Service: Integrated Behavioral Health- Individual/Family Interpretor:No. Interpretor Name and Language: not applicable.   SUBJECTIVE: Cameron [name] is a 49 y.o. male accompanied by herself. Patient was referred by [name] NP for mental health.  Patient reports the following symptoms/concerns: He notes that he has been doing well. He explains that he has been feeling tired from work. 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 He discussed situational stressors with his job. He reports that he had a little bit of a panic attack and became really stressed out last Saturday at work. He notes that one positive thing is that his work is close by. He reports that other than that he has been doing okay. He notes that he keeps a scratch pad that he writes down thoughts, inspirational quotes, poems, and draw. He denies suicidal and homicidal thoughts.  Duration of problem: ; Severity of problem: mild  OBJECTIVE: Mood: Euthymic and Affect: Appropriate Risk of harm to self or others: No plan to harm self or others  LIFE CONTEXT: Family and Social: see above. School/Work: see above. Self-Care: see above. Life Changes: see above.   GOALS ADDRESSED: Patient will: 1.  Reduce symptoms of: insomnia and stress  2.  Increase knowledge and/or ability of: self-management skills and stress reduction  3.  Demonstrate ability to: Increase healthy adjustment to current life circumstances  INTERVENTIONS: Interventions utilized:  Brief CBT was utilized by the clinician during today's follow up session. Clinician processed with the patient regarding how he has been doing since the last follow up session. Clinician measured the patient's anxiety and depression on a numerical scale. Clinician  informed the patient he needs to update his eligibility before he can be able to pick up his psychotropic medications or schedule future appointments. Clinician suggested that the patient try sleep stories app to help him fall asleep at bedtime.  Standardized Assessments completed: GAD-7 and PHQ 9  ASSESSMENT: Patient currently experiencing see above.   Patient may benefit from see above.  PLAN: 1. Follow up with behavioral health clinician on :  2. Behavioral recommendations:  3. Referral(s): Integrated [facility] (In Clinic) 4. "From scale of 1-10, how likely are you to follow plan?":   Althia Forts, LCSW

## 2020-04-14 ENCOUNTER — Other Ambulatory Visit: Payer: Self-pay

## 2020-04-14 ENCOUNTER — Ambulatory Visit: Payer: Self-pay | Attending: Internal Medicine

## 2020-04-14 DIAGNOSIS — Z8659 Personal history of other mental and behavioral disorders: Secondary | ICD-10-CM

## 2020-04-14 DIAGNOSIS — Z23 Encounter for immunization: Secondary | ICD-10-CM

## 2020-04-14 MED ORDER — SERTRALINE HCL 100 MG PO TABS: 100.0000 mg | ORAL_TABLET | Freq: Every day | ORAL | 2 refills | Status: DC

## 2020-04-14 MED ORDER — TRAZODONE HCL 50 MG PO TABS: 50.0000 mg | ORAL_TABLET | Freq: Every day | ORAL | 1 refills | Status: DC

## 2020-04-14 NOTE — Progress Notes (Signed)
   Covid-19 Vaccination Clinic  Name:  SHAMUS DESANTIS    MRN: [id number] DOB: [birth year]  04/14/2020  Mr. Buchberger was observed post Covid-19 immunization for 15 minutes without incident. He was provided with Vaccine Information Sheet and instruction to access the V-Safe system.   Mr. Studstill was instructed to call 911 with any severe reactions post vaccine: Marland Kitchen Difficulty breathing  . Swelling of face and throat  . A fast heartbeat  . A bad rash all over body  . Dizziness and weakness   Immunizations Administered    Name Date Dose VIS Date Route   Pfizer COVID-19 Vaccine 04/14/2020  3:48 PM 0.3 mL 01/15/2019 Intramuscular   Manufacturer: [employer], [employer]   Lot: [id number]   NDC: [address]-2

## 2020-04-30 ENCOUNTER — Ambulatory Visit: Payer: Self-pay | Admitting: Licensed Clinical Social Worker

## 2020-05-05 ENCOUNTER — Ambulatory Visit: Payer: Self-pay | Admitting: Licensed Clinical Social Worker

## 2020-05-14 ENCOUNTER — Ambulatory Visit: Payer: Self-pay | Admitting: Licensed Clinical Social Worker

## 2020-05-14 ENCOUNTER — Other Ambulatory Visit: Payer: Self-pay

## 2020-05-14 DIAGNOSIS — F418 Other specified anxiety disorders: Secondary | ICD-10-CM

## 2020-05-14 DIAGNOSIS — F1021 Alcohol dependence, in remission: Secondary | ICD-10-CM

## 2020-05-14 DIAGNOSIS — F330 Major depressive disorder, recurrent, mild: Secondary | ICD-10-CM

## 2020-05-14 NOTE — BH Specialist Note (Signed)
Integrated Behavioral Health Follow Up Visit Via Phone  MRN: [id number] Name: Cameron [name]  Type of Service: Integrated Behavioral Health- Individual/Family Interpretor:No. Interpretor Name and Language: not applicable.   SUBJECTIVE: Cameron [name] is a 49 y.o. male accompanied by herself. Patient was referred by [name] NP for mental health. Patient reports the following symptoms/concerns: He notes that he has been doing alright and is at a different job now. He notes that he left his other job due to irreconcilable differences. 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 He discussed situational stressors with co workers, his pets, lack of sleep, and people in general. He discussed his lack of intolerance of with certain people and strangers. He asked why he needed to update his eligibility because he thought they were friends and asked if they could just meet for coffee. He reports that he wants her to meet his dog. He denies suicidal and homicidal thoughts.  Duration of problem: ; Severity of problem: mild  OBJECTIVE: Mood: Euthymic and Affect: Appropriate Risk of harm to self or others: No plan to harm self or others  LIFE CONTEXT: Family and Social: see above. School/Work: see above.  Self-Care: see above. Life Changes: see above.   GOALS ADDRESSED: Patient will: 1.  Reduce symptoms of: agitation  2.  Increase knowledge and/or ability of: coping skills and self-management skills  3.  Demonstrate ability to: Increase healthy adjustment to current life circumstances  INTERVENTIONS: Interventions utilized:  Supportive Counseling was utilized by the clinician during today's follow up session. Clinician processed with the patient regarding how he has been doing since the last follow up session. Clinician measured the patient's anxiety and depression on a numerical scale. Clinician discussed the concept of distress tolerance with the patient.  Clinician explained to the patient that he needs to update his eligibility before any future appointments can be scheduled. Clinician explained to the patient that she is his therapist, not his friend and therefore they would not be meeting for coffee outside of [facility]. Clinician explained to the patient that it was highly inappropriate and that they would stick to therapist patient relationship only.  Standardized Assessments completed: GAD-7 and PHQ 9 GAD 7 8 PHQ 9 10 ASSESSMENT: Patient currently experiencing see above.  Patient may benefit from see above.  PLAN: 1. Follow up with behavioral health clinician on :  2. Behavioral recommendations: see above. 3. Referral(s): Integrated [facility] (In Clinic) 4. "From scale of 1-10, how likely are you to follow plan?":   Althia Forts, LCSW

## 2020-10-07 ENCOUNTER — Other Ambulatory Visit: Payer: Self-pay | Admitting: Gerontology

## 2020-10-07 DIAGNOSIS — J45909 Unspecified asthma, uncomplicated: Secondary | ICD-10-CM

## 2020-10-07 MED ORDER — ALBUTEROL SULFATE HFA 108 (90 BASE) MCG/ACT IN AERS: 2.0000 | INHALATION_SPRAY | RESPIRATORY_TRACT | 3 refills | Status: DC | PRN

## 2020-10-09 ENCOUNTER — Telehealth: Payer: Self-pay | Admitting: Pharmacy Technician

## 2020-10-09 NOTE — Telephone Encounter (Signed)
Received updated proof of income.  Patient eligible to receive medication assistance at [facility] until time for re-certification in 9359, and as long as eligibility requirements continue to be met.  [facility]

## 2020-10-22 ENCOUNTER — Other Ambulatory Visit: Payer: Self-pay

## 2020-10-22 ENCOUNTER — Ambulatory Visit: Payer: Self-pay | Admitting: Family Medicine

## 2020-10-22 VITALS — BP 157/107 | HR 107 | Temp 96.4°F | Resp 18 | Ht 67.0 in | Wt 175.0 lb

## 2020-10-22 DIAGNOSIS — Z0000 Encounter for general adult medical examination without abnormal findings: Secondary | ICD-10-CM

## 2020-10-22 DIAGNOSIS — R000 Tachycardia, unspecified: Secondary | ICD-10-CM

## 2020-10-22 DIAGNOSIS — M130 Polyarthritis, unspecified: Secondary | ICD-10-CM

## 2020-10-22 DIAGNOSIS — J45909 Unspecified asthma, uncomplicated: Secondary | ICD-10-CM

## 2020-10-22 DIAGNOSIS — R5383 Other fatigue: Secondary | ICD-10-CM

## 2020-10-22 DIAGNOSIS — M2550 Pain in unspecified joint: Secondary | ICD-10-CM

## 2020-10-22 DIAGNOSIS — I10 Essential (primary) hypertension: Secondary | ICD-10-CM

## 2020-10-22 DIAGNOSIS — Z8659 Personal history of other mental and behavioral disorders: Secondary | ICD-10-CM

## 2020-10-22 MED ORDER — DICLOFENAC SODIUM 1 % EX GEL: 2.0000 g | Freq: Four times a day (QID) | CUTANEOUS | 0 refills | Status: DC | PRN

## 2020-10-22 MED ORDER — ALBUTEROL SULFATE HFA 108 (90 BASE) MCG/ACT IN AERS: 2.0000 | INHALATION_SPRAY | RESPIRATORY_TRACT | 3 refills | Status: DC | PRN

## 2020-10-22 MED ORDER — CETIRIZINE HCL 10 MG PO TABS: 10.0000 mg | ORAL_TABLET | Freq: Every day | ORAL | 0 refills | Status: AC

## 2020-10-22 MED ORDER — TRAZODONE HCL 50 MG PO TABS: 50.0000 mg | ORAL_TABLET | Freq: Every day | ORAL | 1 refills | Status: DC

## 2020-10-22 MED ORDER — AMLODIPINE BESYLATE 5 MG PO TABS: 5.0000 mg | ORAL_TABLET | Freq: Every day | ORAL | 1 refills | Status: DC

## 2020-10-22 MED ORDER — SERTRALINE HCL 50 MG PO TABS: 50.0000 mg | ORAL_TABLET | Freq: Every day | ORAL | 1 refills | Status: DC

## 2020-10-22 NOTE — Progress Notes (Signed)
Problem List Items Addressed This Visit      Cardiovascular and Mediastinum   Essential hypertension    Start amlodipine for elevated BP.  Follow CMP       Relevant Medications   amLODipine (NORVASC) 5 MG tablet     Respiratory   Asthma due to environmental allergies   Relevant Medications   albuterol (VENTOLIN HFA) 108 (90 Base) MCG/ACT inhaler     Other   Fatigue    Follow TSH and CBC (eval for anemia) as well as CMP Possibly related to underlying depression/anxiety He's asking about getting testosterone checked, but will check above first       Joint pain    He describes migratory joint pain Father hx of RA? No significant ttp on exam today Follow CRP, RF, CCP, ANA Consider rheumatology referral  Trial of voltaren       History of anxiety   Relevant Medications   sertraline (ZOLOFT) 50 MG tablet   Tachycardia    Low grade tachycardia - regular on exam He notes this has been the case the last few times he's tried to donate plasma Follow CBC, TSH  Would benefit from formal EKG (not able to perform [facility]) at some point, especially if labs are unrevealing and this is persistent.        Other Visit Diagnoses    Polyarticular arthritis    -  Primary   Relevant Orders   C-reactive protein   Rheumatoid Factor   Cyclic citrul peptide antibody, IgG (QUEST)   Antinuclear Antib (ANA)   Tachycardia, unspecified       Relevant Orders   TSH   CBC   Healthcare maintenance       Relevant Orders   Comp Met (CMET)       Established Patient Office Visit  Subjective:  Patient ID: Cameron [name], male    DOB: [birth year]  Age: 49 y.o. MRN: [id number]  CC:  Chief Complaint  Patient presents with  . Medication Refill    has been off zoloft 100 mg for anxiety and would like to get back on it; needs refill of albuterol inhaler  . Tachycardia    wants to donate plasma, but HR has been too high   . Temporomandibular Joint Pain    joint pain; random and  almost all joints; can be debilitating in wrists   . Fatigue    wants testosterone labs drawn    HPI Cameron [name] presents for   Anxiety/depression: off/on, since 2000 - has been on meds off/on - zoloft worked best - 100 mg - was unemployed, procrastinated, tapered off 2.5-3 months ago - started [name] new job today, looking for job for 2-3 months - no SI -   Chronic joint issues - migratory joint pain - hands/wrist recently - limited mobility of fingers this morning/hand - wakes him up at night - no fevers, chills, night sweats - some heavy labor in past - used to do body building   HR up recently - no CP, no SOB  Asthma - albuterol  Past Medical History:  Diagnosis Date  . Allergy   . Asthma   . Depression   . Substance abuse ([place])     No past surgical history on file.  Family History  Problem Relation Age of Onset  . Diabetes Father     Social History   Socioeconomic History  . Marital status: Single    Spouse name: Not  on file  . Number of children: Not on file  . Years of education: Not on file  . Highest education level: Not on file  Occupational History  . Occupation: unemployed  Tobacco Use  . Smoking status: Never Smoker  . Smokeless tobacco: Current User    Types: Chew  Vaping Use  . Vaping Use: Never used  Substance and Sexual Activity  . Alcohol use: Not Currently  . Drug use: Never  . Sexual activity: Not on file  Other Topics Concern  . Not on file  Social History Narrative   Social determinants screening completed 02/18/2020. Patient has transportation and is in stable housing. 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 HS   Social Determinants of Health   Financial Resource Strain: Low Risk   . Difficulty of Paying Living Expenses: Not very hard  Food Insecurity: No Food Insecurity  . Worried About Charity fundraiser in the Last Year: Never true  . Ran Out of Food in the Last Year: Never true  Transportation Needs: No Transportation  Needs  . Lack of Transportation (Medical): No  . Lack of Transportation (Non-Medical): No  Physical Activity: Inactive  . Days of Exercise per Week: 0 days  . Minutes of Exercise per Session: 0 min  Stress: Stress Concern Present  . Feeling of Stress : To some extent  Social Connections: Socially Isolated  . Frequency of Communication with Friends and Family: More than three times Quanda Pavlicek week  . Frequency of Social Gatherings with Friends and Family: More than three times Meta Kroenke week  . Attends Religious Services: Never  . Active Member of Clubs or Organizations: No  . Attends Archivist Meetings: Never  . Marital Status: Never married  Intimate Partner Violence: Not At Risk  . Fear of Current or Ex-Partner: No  . Emotionally Abused: No  . Physically Abused: No  . Sexually Abused: No    Outpatient Medications Prior to Visit  Medication Sig Dispense Refill  . ibuprofen (ADVIL) 600 MG tablet Take 1 tablet (600 mg total) by mouth every 8 (eight) hours as needed. 15 tablet 0  . albuterol (VENTOLIN HFA) 108 (90 Base) MCG/ACT inhaler Inhale 2 puffs into the lungs every 4 (four) hours as needed for wheezing or shortness of breath. 1 each 3  . fluticasone (FLONASE) 50 MCG/ACT nasal spray PLACE 2 SPRAYS INT BOTH NOSTRILS AT BEDTIME (Patient not taking: Reported on 10/22/2020) 16 g 0  . fluticasone (FLOVENT HFA) 110 MCG/ACT inhaler Inhale 1 puff into the lungs 2 (two) times daily. (Patient not taking: Reported on 10/22/2020) 1 Inhaler 12  . oxyCODONE-acetaminophen (PERCOCET) 7.5-325 MG tablet Take 1 tablet by mouth every 6 (six) hours as needed. (Patient not taking: Reported on 10/22/2020) 20 tablet 0  . cetirizine (ZYRTEC) 10 MG tablet TAKE ONE TABLET BY MOUTH AS NEEDED FOR ALLERGIES (Patient not taking: Reported on 10/22/2020) 60 tablet 0  . sertraline (ZOLOFT) 100 MG tablet Take 1 tablet (100 mg total) by mouth at bedtime. (Patient not taking: Reported on 10/22/2020) 30 tablet 2  . traZODone  (DESYREL) 50 MG tablet Take 1 tablet (50 mg total) by mouth at bedtime. 30 tablet 1   No facility-administered medications prior to visit.    No Known Allergies  ROS Review of Systems As above   Objective:    Physical Exam Constitutional:      Appearance: Normal appearance.  HENT:     Head: Normocephalic and atraumatic.  Cardiovascular:     Rate and Rhythm: Regular rhythm. Tachycardia present.     Pulses: Normal pulses.     Heart sounds: Normal heart sounds.  Pulmonary:     Effort: Pulmonary effort is normal. No respiratory distress.     Breath sounds: Normal breath sounds.  Abdominal:     General: There is no distension.     Palpations: Abdomen is soft.     Tenderness: There is no abdominal tenderness.  Musculoskeletal:        General: No swelling or tenderness.     Cervical back: Neck supple.     Comments: No noted TTP  Skin:    General: Skin is warm and dry.  Neurological:     General: No focal deficit present.     Mental Status: He is alert.  Psychiatric:     Comments: Notes depressed mood and anxiety     BP (!) 157/107   Pulse (!) 107   Temp (!) 96.4 F (35.8 C)   Resp 18   Ht 5' 7"  (1.702 m)   Wt 79.4 kg   SpO2 92%   BMI 27.41 kg/m  Wt Readings from Last 3 Encounters:  10/22/20 79.4 kg  12/04/19 81.6 kg  12/04/19 81.6 kg     Health Maintenance Due  Topic Date Due  . Hepatitis C Screening  Never done  . HIV Screening  Never done  . TETANUS/TDAP  06/26/2019  . INFLUENZA VACCINE  06/21/2020    There are no preventive care reminders to display for this patient.  Lab Results  Component Value Date   TSH 2.370 01/03/2019   Lab Results  Component Value Date   WBC 7.2 01/03/2019   HGB 15.7 01/03/2019   HCT 46.3 01/03/2019   MCV 90 01/03/2019   PLT 243 01/03/2019   Lab Results  Component Value Date   NA 140 01/03/2019   K 4.6 01/03/2019   CO2 25 01/03/2019   GLUCOSE 83 01/03/2019   BUN 9 01/03/2019   CREATININE 1.09 01/03/2019    BILITOT 0.4 01/03/2019   ALKPHOS 63 01/03/2019   AST 21 01/03/2019   ALT 16 01/03/2019   PROT 7.0 01/03/2019   ALBUMIN 4.6 01/03/2019   CALCIUM 9.5 01/03/2019   Lab Results  Component Value Date   CHOL 132 01/03/2019   Lab Results  Component Value Date   HDL 37 (L) 01/03/2019   Lab Results  Component Value Date   LDLCALC 74 01/03/2019   Lab Results  Component Value Date   TRIG 104 01/03/2019   Lab Results  Component Value Date   CHOLHDL 3.6 01/03/2019   Lab Results  Component Value Date   HGBA1C 5.4 01/03/2019      Assessment & Plan:   Problem List Items Addressed This Visit      Cardiovascular and Mediastinum   Essential hypertension    Start amlodipine for elevated BP.  Follow CMP       Relevant Medications   amLODipine (NORVASC) 5 MG tablet     Respiratory   Asthma due to environmental allergies   Relevant Medications   albuterol (VENTOLIN HFA) 108 (90 Base) MCG/ACT inhaler     Other   Fatigue    Follow TSH and CBC (eval for anemia) as well as CMP Possibly related to underlying depression/anxiety He's asking about getting testosterone checked, but will check above first       Joint pain    He describes migratory joint pain  Father hx of RA? No significant ttp on exam today Follow CRP, RF, CCP, ANA Consider rheumatology referral  Trial of voltaren       History of anxiety   Relevant Medications   sertraline (ZOLOFT) 50 MG tablet   Tachycardia    Low grade tachycardia - regular on exam He notes this has been the case the last few times he's tried to donate plasma Follow CBC, TSH  Would benefit from formal EKG (not able to perform [facility]) at some point, especially if labs are unrevealing and this is persistent.        Other Visit Diagnoses    Polyarticular arthritis    -  Primary   Relevant Orders   C-reactive protein   Rheumatoid Factor   Cyclic citrul peptide antibody, IgG (QUEST)   Antinuclear Antib (ANA)   Tachycardia,  unspecified       Relevant Orders   TSH   CBC   Healthcare maintenance       Relevant Orders   Comp Met (CMET)      Meds ordered this encounter  Medications  . diclofenac Sodium (VOLTAREN) 1 % GEL    Sig: Apply 2 g topically 4 (four) times daily as needed.    Dispense:  50 g    Refill:  0  . albuterol (VENTOLIN HFA) 108 (90 Base) MCG/ACT inhaler    Sig: Inhale 2 puffs into the lungs every 4 (four) hours as needed for wheezing or shortness of breath.    Dispense:  1 each    Refill:  3  . sertraline (ZOLOFT) 50 MG tablet    Sig: Take 1 tablet (50 mg total) by mouth at bedtime.    Dispense:  30 tablet    Refill:  1  . cetirizine (ZYRTEC) 10 MG tablet    Sig: Take 1 tablet (10 mg total) by mouth daily.    Dispense:  60 tablet    Refill:  0    ORIGINAL PRESCRIBER (TUKOV, MAGADALENE)  . traZODone (DESYREL) 50 MG tablet    Sig: Take 1 tablet (50 mg total) by mouth at bedtime.    Dispense:  30 tablet    Refill:  1  . amLODipine (NORVASC) 5 MG tablet    Sig: Take 1 tablet (5 mg total) by mouth daily.    Dispense:  30 tablet    Refill:  1    Follow-up: Return in about 4 weeks (around 11/19/2020).    Fayrene Helper, MD

## 2020-10-22 NOTE — Patient Instructions (Addendum)
Your blood pressure is high.  We're going to start you on amlodipine.  Please follow up for with your blood pressure in about 1 month.  We're going to check your blood counts for your fast heart rate.  We'll also check your thyroid.  We're going to check inflammatory labs for your joint pain.  Try voltaren gel to the affected joints.    We're refilling your zoloft.  Follow up with your therapist.    Follow up in 1 month to review your labs and blood pressure.

## 2020-10-26 DIAGNOSIS — R000 Tachycardia, unspecified: Secondary | ICD-10-CM | POA: Insufficient documentation

## 2020-10-26 DIAGNOSIS — I10 Essential (primary) hypertension: Secondary | ICD-10-CM | POA: Insufficient documentation

## 2020-10-26 NOTE — Assessment & Plan Note (Addendum)
Low grade tachycardia - regular on exam He notes this has been the case the last few times he's tried to donate plasma Follow CBC, TSH  Would benefit from formal EKG (not able to perform [facility]) at some point, especially if labs are unrevealing and this is persistent.

## 2020-10-26 NOTE — Assessment & Plan Note (Addendum)
He describes migratory joint pain Father hx of RA? No significant ttp on exam today Follow CRP, RF, CCP, ANA Consider rheumatology referral  Trial of voltaren

## 2020-10-26 NOTE — Assessment & Plan Note (Addendum)
Start amlodipine for elevated BP.  Follow CMP

## 2020-10-26 NOTE — Assessment & Plan Note (Signed)
Follow TSH and CBC (eval for anemia) as well as CMP Possibly related to underlying depression/anxiety He's asking about getting testosterone checked, but will check above first

## 2020-10-27 ENCOUNTER — Ambulatory Visit: Payer: Self-pay | Admitting: Licensed Clinical Social Worker

## 2020-11-05 ENCOUNTER — Ambulatory Visit: Payer: Self-pay

## 2020-11-26 ENCOUNTER — Other Ambulatory Visit: Payer: Self-pay

## 2020-11-26 ENCOUNTER — Other Ambulatory Visit: Payer: Self-pay | Admitting: Gerontology

## 2020-12-01 ENCOUNTER — Other Ambulatory Visit: Payer: Self-pay | Admitting: Gerontology

## 2020-12-03 ENCOUNTER — Other Ambulatory Visit: Payer: Self-pay

## 2020-12-24 ENCOUNTER — Ambulatory Visit: Payer: Self-pay

## 2020-12-25 ENCOUNTER — Other Ambulatory Visit: Payer: Self-pay | Admitting: Gerontology

## 2020-12-25 DIAGNOSIS — Z8659 Personal history of other mental and behavioral disorders: Secondary | ICD-10-CM

## 2020-12-29 ENCOUNTER — Telehealth: Payer: Self-pay

## 2020-12-29 NOTE — Telephone Encounter (Signed)
Scheduled appt with Heather on 2/22 per staff comment.

## 2021-01-12 ENCOUNTER — Institutional Professional Consult (permissible substitution): Payer: Self-pay | Admitting: Licensed Clinical Social Worker

## 2021-01-12 ENCOUNTER — Telehealth: Payer: Self-pay | Admitting: Licensed Clinical Social Worker

## 2021-01-12 NOTE — Telephone Encounter (Signed)
Called patient twice during today's scheduled appointment time, was unable to reach the patient and left voice mail with the clinic's contact information.

## 2021-01-14 ENCOUNTER — Ambulatory Visit: Payer: Self-pay | Admitting: Gerontology

## 2021-01-20 ENCOUNTER — Other Ambulatory Visit: Payer: Self-pay | Admitting: Gerontology

## 2021-01-20 ENCOUNTER — Ambulatory Visit: Payer: Self-pay | Admitting: Licensed Clinical Social Worker

## 2021-01-20 ENCOUNTER — Telehealth: Payer: Self-pay | Admitting: Licensed Clinical Social Worker

## 2021-01-20 DIAGNOSIS — Z8659 Personal history of other mental and behavioral disorders: Secondary | ICD-10-CM

## 2021-01-20 NOTE — Telephone Encounter (Signed)
Called the patient three times during today's scheduled appointment time; no answer, left a voicemail with the clinic contact information.

## 2021-01-21 ENCOUNTER — Other Ambulatory Visit: Payer: Self-pay | Admitting: Gerontology

## 2021-01-21 DIAGNOSIS — Z8659 Personal history of other mental and behavioral disorders: Secondary | ICD-10-CM

## 2021-01-21 MED ORDER — SERTRALINE HCL 50 MG PO TABS: 50.0000 mg | ORAL_TABLET | Freq: Every day | ORAL | 0 refills | Status: DC

## 2021-01-21 MED ORDER — TRAZODONE HCL 50 MG PO TABS: 50.0000 mg | ORAL_TABLET | Freq: Every day | ORAL | 0 refills | Status: DC

## 2021-01-26 ENCOUNTER — Ambulatory Visit: Payer: Self-pay | Admitting: Gerontology

## 2021-01-27 ENCOUNTER — Ambulatory Visit: Payer: Self-pay | Admitting: Licensed Clinical Social Worker

## 2021-01-27 ENCOUNTER — Telehealth: Payer: Self-pay | Admitting: Licensed Clinical Social Worker

## 2021-01-27 NOTE — Telephone Encounter (Signed)
Called the patient twice during today's scheduled appointment; no answer, left a voicemail with the clinic contact information.   

## 2021-02-18 ENCOUNTER — Other Ambulatory Visit: Payer: Self-pay | Admitting: Gerontology

## 2021-02-18 DIAGNOSIS — J45909 Unspecified asthma, uncomplicated: Secondary | ICD-10-CM

## 2021-02-18 MED ORDER — ALBUTEROL SULFATE HFA 108 (90 BASE) MCG/ACT IN AERS: 2.0000 | INHALATION_SPRAY | RESPIRATORY_TRACT | 0 refills | Status: DC | PRN

## 2021-02-22 ENCOUNTER — Other Ambulatory Visit: Payer: Self-pay

## 2021-02-23 ENCOUNTER — Other Ambulatory Visit: Payer: Self-pay

## 2021-02-23 ENCOUNTER — Ambulatory Visit: Payer: Self-pay | Admitting: Gerontology

## 2021-02-23 ENCOUNTER — Encounter: Payer: Self-pay | Admitting: Gerontology

## 2021-02-23 VITALS — BP 122/79 | HR 88 | Temp 98.4°F | Resp 16 | Wt 175.0 lb

## 2021-02-23 DIAGNOSIS — Z0000 Encounter for general adult medical examination without abnormal findings: Secondary | ICD-10-CM

## 2021-02-23 DIAGNOSIS — I10 Essential (primary) hypertension: Secondary | ICD-10-CM

## 2021-02-23 DIAGNOSIS — Z8659 Personal history of other mental and behavioral disorders: Secondary | ICD-10-CM

## 2021-02-23 DIAGNOSIS — F339 Major depressive disorder, recurrent, unspecified: Secondary | ICD-10-CM

## 2021-02-23 MED ORDER — TRAZODONE HCL 50 MG PO TABS
ORAL_TABLET | ORAL | 0 refills | Status: DC
  Filled 2021-02-23: qty 2, 2d supply, fill #0

## 2021-02-23 MED ORDER — SERTRALINE HCL 50 MG PO TABS
ORAL_TABLET | ORAL | 0 refills | Status: DC
  Filled 2021-02-23: qty 2, 2d supply, fill #0

## 2021-02-23 NOTE — Progress Notes (Signed)
Established Patient Office Visit  Subjective:  Patient ID: Cameron [name], male    DOB: [birth year]  Age: 49 y.o. MRN: [id number]  CC: No chief complaint on file.   HPI Cameron [name] 50 y/o male who has a history of Allergy, Asthma, Depression and Hypertension presents for routine visit. He was non compliant with his follow up visits. He states that he adheres  with his medications, denies any side effects and was out of his 50 mg Zoloft and 50 mg Trazodone 2 days ago. He states that his mood is good, started a new job at [employer], denies suicidal nor homicidal ideation. He doesn't check his blood pressure at home but states that he's compliant with DASH diet. He denies any wheezing, chest tightness nor shortness of breath. Overall, he states that he's doing well and offers no further complaint.  Past Medical History:  Diagnosis Date  . Allergy   . Asthma   . Depression   . Substance abuse ([place])     No past surgical history on file.  Family History  Problem Relation Age of Onset  . Diabetes Father     Social History   Socioeconomic History  . Marital status: Single    Spouse name: Not on file  . Number of children: Not on file  . Years of education: Not on file  . Highest education level: Not on file  Occupational History  . Occupation: unemployed  Tobacco Use  . Smoking status: Never Smoker  . Smokeless tobacco: Current User    Types: Chew  Vaping Use  . Vaping Use: Never used  Substance and Sexual Activity  . Alcohol use: Not Currently  . Drug use: Never  . Sexual activity: Not on file  Other Topics Concern  . Not on file  Social History Narrative   Social determinants screening completed 02/18/2020. Patient has transportation and is in stable housing. He is working with a temp agency to find work. HS   Social Determinants of Health   Financial Resource Strain: Not on file  Food Insecurity: Not on file  Transportation Needs: Not on file  Physical  Activity: Not on file  Stress: Not on file  Social Connections: Not on file  Intimate Partner Violence: Not on file    Outpatient Medications Prior to Visit  Medication Sig Dispense Refill  . albuterol (VENTOLIN HFA) 108 (90 Base) MCG/ACT inhaler INHALE 2 PUFFS INTO THE LUNGS EVERY 4 HOURS AS NEEDED FOR WHEEZING OR SHORTNESS OF BREATH 6.7 g 0  . amLODipine (NORVASC) 5 MG tablet TAKE ONE TABLET BY MOUTH EVERY DAY 30 tablet 3  . cetirizine (ZYRTEC) 10 MG tablet Take 1 tablet (10 mg total) by mouth daily. 60 tablet 0  . diclofenac Sodium (VOLTAREN) 1 % GEL Apply 2 g topically 4 (four) times daily as needed. 50 g 0  . fluticasone (FLOVENT HFA) 110 MCG/ACT inhaler Inhale 1 puff into the lungs 2 (two) times daily. (Patient not taking: Reported on 10/22/2020) 1 Inhaler 12  . ibuprofen (ADVIL) 600 MG tablet Take 1 tablet (600 mg total) by mouth every 8 (eight) hours as needed. 15 tablet 0  . fluticasone (FLONASE) 50 MCG/ACT nasal spray PLACE 2 SPRAYS INT BOTH NOSTRILS AT BEDTIME (Patient not taking: Reported on 10/22/2020) 16 g 0  . oxyCODONE-acetaminophen (PERCOCET) 7.5-325 MG tablet Take 1 tablet by mouth every 6 (six) hours as needed. (Patient not taking: Reported on 10/22/2020) 20 tablet 0  . sertraline (ZOLOFT)  50 MG tablet TAKE ONE TABLET BY MOUTH AT BEDTIME *PER DR. NEED TO MAKE APPOINTMENT WITH MS.PRUITT ASAP* 14 tablet 0  . traZODone (DESYREL) 50 MG tablet TAKE ONE TABLET BY MOUTH AT BEDTIME *PER DR. NEEDS TO MAKE AN APPOINTMENT WITH PSYCH PROVIDER ASAP* 14 tablet 0   No facility-administered medications prior to visit.    No Known Allergies  ROS Review of Systems  Constitutional: Negative.   Eyes: Negative.   Respiratory: Negative.   Cardiovascular: Negative.   Neurological: Negative.   Psychiatric/Behavioral: Negative.       Objective:    Physical Exam HENT:     Head: Normocephalic and atraumatic.  Eyes:     Extraocular Movements: Extraocular movements intact.      Conjunctiva/sclera: Conjunctivae normal.     Pupils: Pupils are equal, round, and reactive to light.  Cardiovascular:     Rate and Rhythm: Normal rate and regular rhythm.     Pulses: Normal pulses.     Heart sounds: Normal heart sounds.  Pulmonary:     Effort: Pulmonary effort is normal.     Breath sounds: Normal breath sounds.  Skin:    General: Skin is warm.  Neurological:     General: No focal deficit present.     Mental Status: He is alert and oriented to person, place, and time. Mental status is at baseline.  Psychiatric:        Mood and Affect: Mood normal.        Behavior: Behavior normal.        Thought Content: Thought content normal.        Judgment: Judgment normal.     BP 122/79 (BP Location: Left Arm, Patient Position: Sitting, Cuff Size: Large)   Pulse 88   Temp 98.4 F (36.9 C)   Resp 16   Wt 175 lb (79.4 kg)   SpO2 96%   BMI 27.41 kg/m  Wt Readings from Last 3 Encounters:  02/23/21 175 lb (79.4 kg)  10/22/20 175 lb (79.4 kg)  12/04/19 179 lb 14.3 oz (81.6 kg)   Weight loss encouraged  Health Maintenance Due  Topic Date Due  . Hepatitis C Screening  Never done  . HIV Screening  Never done  . COLONOSCOPY (Pts 45-19yr Insurance coverage will need to be confirmed)  Never done  . TETANUS/TDAP  06/26/2019  . COVID-19 Vaccine (3 - Booster for Pfizer series) 10/15/2020    There are no preventive care reminders to display for this patient.  Lab Results  Component Value Date   TSH 2.370 01/03/2019   Lab Results  Component Value Date   WBC 7.2 01/03/2019   HGB 15.7 01/03/2019   HCT 46.3 01/03/2019   MCV 90 01/03/2019   PLT 243 01/03/2019   Lab Results  Component Value Date   NA 140 01/03/2019   K 4.6 01/03/2019   CO2 25 01/03/2019   GLUCOSE 83 01/03/2019   BUN 9 01/03/2019   CREATININE 1.09 01/03/2019   BILITOT 0.4 01/03/2019   ALKPHOS 63 01/03/2019   AST 21 01/03/2019   ALT 16 01/03/2019   PROT 7.0 01/03/2019   ALBUMIN 4.6 01/03/2019    CALCIUM 9.5 01/03/2019   Lab Results  Component Value Date   CHOL 132 01/03/2019   Lab Results  Component Value Date   HDL 37 (L) 01/03/2019   Lab Results  Component Value Date   LDLCALC 74 01/03/2019   Lab Results  Component Value Date   TRIG 104  01/03/2019   Lab Results  Component Value Date   CHOLHDL 3.6 01/03/2019   Lab Results  Component Value Date   HGBA1C 5.4 01/03/2019      Assessment & Plan:    1. History of anxiety - He will continue on current medication and will [facility] with [facility] tomoroow with Ms. Pruitt. - sertraline (ZOLOFT) 50 MG tablet; TAKE ONE TABLET BY MOUTH AT BEDTIME *PER DR. NEED TO MAKE APPOINTMENT WITH MS.PRUITT ASAP*  Dispense: 2 tablet; Refill: 0 - traZODone (DESYREL) 50 MG tablet; TAKE ONE TABLET BY MOUTH AT BEDTIME *PER DR. NEEDS TO MAKE AN APPOINTMENT WITH PSYCH PROVIDER ASAP*  Dispense: 2 tablet; Refill: 0  2. Essential hypertension - His blood pressure is under control, he will continue on current medication and DASH diet.  3. Depression, recurrent ([place]) - Same as #1 - sertraline (ZOLOFT) 50 MG tablet; TAKE ONE TABLET BY MOUTH AT BEDTIME *PER DR. NEED TO MAKE APPOINTMENT WITH MS.PRUITT ASAP*  Dispense: 2 tablet; Refill: 0 - traZODone (DESYREL) 50 MG tablet; TAKE ONE TABLET BY MOUTH AT BEDTIME *PER DR. NEEDS TO MAKE AN APPOINTMENT WITH PSYCH PROVIDER ASAP*  Dispense: 2 tablet; Refill: 0  4. Healthcare maintenance - Routine labs will be checked. - CBC w/Diff; Future - Comp Met (CMET); Future - Lipid panel; Future - TSH; Future - Testosterone; Future - HgB A1c; Future - Urinalysis; Future    Follow-up: Return in about 1 month (around 03/25/2021), or if symptoms worsen or fail to improve.    Maame Dack Jerold Coombe, NP

## 2021-02-23 NOTE — Patient Instructions (Signed)

## 2021-02-24 ENCOUNTER — Ambulatory Visit: Payer: Self-pay | Admitting: Licensed Clinical Social Worker

## 2021-02-24 ENCOUNTER — Other Ambulatory Visit: Payer: Self-pay

## 2021-02-24 DIAGNOSIS — F339 Major depressive disorder, recurrent, unspecified: Secondary | ICD-10-CM

## 2021-02-24 DIAGNOSIS — F411 Generalized anxiety disorder: Secondary | ICD-10-CM

## 2021-02-24 NOTE — BH Specialist Note (Signed)
Integrated Behavioral Health Follow Up In-Person Visit  MRN: [id number] Name: Cameron [name]   Total time: 60 minutes  Types of Service: General Behavioral Integrated Care (BHI)  Interpretor:No. Interpretor Name and Language:   Subjective: Cameron [name] is a 50 y.o. male accompanied by Himself Patient was referred by [name], NP  For mental health Patient reports the following symptoms/concerns: The patient reported that he is having good days and bad days. 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 He stated that he finds his work enjoyable because he is able to make others around him smile and he does not mind working outdoors. He reports that he tapered off his psychotropic medications and stopped taking them for several months, but began to feel increasingly depressed. He noted that he lacks an appetite and has to force himself to eat at times. He stated that everyday he feels depressed and has no interest in previously enjoyed activities such as going to the gym. He explained that when he is not at work he is laying on the couch. The patient reports he has trouble concentrating and has several days where he feels bad about his appearance. He reported he came to the [facility] and requested refills. He stated that his dosage of Sertraline was decreased from 100 MG to 50 MG and that he feels this is not enough to control his symptoms like before. The patient noted that Trazodone 50 MG is helping him to sleep. He explained that he would like to continue therapy but has a varying work schedule he is concerned about. The patient denied any suicidal or homicidal thoughts.  Duration of problem: ; Severity of problem: moderate  Objective: Mood: Euthymic and Affect: Appropriate Risk of harm to self or others: No plan to harm self or others  Life Context: Family and Social: see above School/Work: see above Self-Care: see above Life  Changes: see above  Patient and/or Family's Strengths/Protective Factors: Concrete supports in place (healthy food, safe environments, etc.)  Goals Addressed: Patient will: 1.  Reduce symptoms of: agitation, anxiety and depression  2.  Increase knowledge and/or ability of: coping skills, healthy habits and self-management skills  3.  Demonstrate ability to: Increase healthy adjustment to current life circumstances  Progress towards Goals: Ongoing  Interventions: Interventions utilized:  CBT Cognitive Behavioral Therapy was utilized by the clinician during today's follow up session. The clinician processed with the patient how they have been doing since the last follow-up session. The clinician provided a space for the patient to ventilate their frustrations regarding their current life circumstances. Clinician measured the patient's anxiety and depression on a numerical scale.Clinician encouraged the patient to take their mediation at the same time everyday exactly as prescribed for it to reach it's full intended effect. Clinician informed patient she would present his case for consultation with Dr. Mare Ferrari, MD, Psychiatric Consultant and [name], NP  on Thursday 03/04/2021 at 9:00 AM. 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 Clinician explained that should the patient experience a crisis he could go to the closest emergency department or utilize [facility]'s criss line or walk- in clinic.    Standardized Assessments completed: GAD-7 and PHQ 9  GAD-7   14 PHQ-9   16  Assessment: Patient currently experiencing see above  Patient may benefit from see above  Plan: 1. Follow up with behavioral health clinician on : 03/16/2021 at 10:30 AM 2. Behavioral recommendations:  3. Referral(s): Integrated [employer] (In Clinic) 4. "From scale of 1-10, how  likely are you to follow plan?":   Judith Part, Student-Social Work

## 2021-02-26 ENCOUNTER — Other Ambulatory Visit: Payer: Self-pay

## 2021-03-04 ENCOUNTER — Other Ambulatory Visit: Payer: Self-pay | Admitting: Gerontology

## 2021-03-04 ENCOUNTER — Other Ambulatory Visit: Payer: Self-pay

## 2021-03-04 DIAGNOSIS — Z8659 Personal history of other mental and behavioral disorders: Secondary | ICD-10-CM

## 2021-03-04 DIAGNOSIS — F339 Major depressive disorder, recurrent, unspecified: Secondary | ICD-10-CM

## 2021-03-04 MED ORDER — TRAZODONE HCL 50 MG PO TABS
50.0000 mg | ORAL_TABLET | Freq: Every day | ORAL | 0 refills | Status: DC
  Filled 2021-03-04: qty 30, 30d supply, fill #0

## 2021-03-04 MED ORDER — SERTRALINE HCL 50 MG PO TABS
100.0000 mg | ORAL_TABLET | Freq: Every day | ORAL | 0 refills | Status: DC
  Filled 2021-03-04: qty 60, 30d supply, fill #0

## 2021-03-05 ENCOUNTER — Other Ambulatory Visit: Payer: Self-pay

## 2021-03-10 ENCOUNTER — Other Ambulatory Visit: Payer: Self-pay

## 2021-03-10 MED FILL — Amlodipine Besylate Tab 5 MG (Base Equivalent): ORAL | 30 days supply | Qty: 30 | Fill #0 | Status: AC

## 2021-03-11 ENCOUNTER — Other Ambulatory Visit: Payer: Self-pay

## 2021-03-11 ENCOUNTER — Other Ambulatory Visit: Payer: Self-pay | Admitting: Gerontology

## 2021-03-11 DIAGNOSIS — J45909 Unspecified asthma, uncomplicated: Secondary | ICD-10-CM

## 2021-03-11 MED ORDER — ALBUTEROL SULFATE HFA 108 (90 BASE) MCG/ACT IN AERS
2.0000 | INHALATION_SPRAY | RESPIRATORY_TRACT | 0 refills | Status: DC | PRN
  Filled 2021-03-11: qty 6.7, 17d supply, fill #0

## 2021-03-12 ENCOUNTER — Other Ambulatory Visit: Payer: Self-pay

## 2021-03-15 ENCOUNTER — Other Ambulatory Visit: Payer: Self-pay

## 2021-03-16 ENCOUNTER — Other Ambulatory Visit: Payer: Self-pay

## 2021-03-16 ENCOUNTER — Ambulatory Visit: Payer: Self-pay | Admitting: Licensed Clinical Social Worker

## 2021-03-16 ENCOUNTER — Telehealth: Payer: Self-pay | Admitting: Pharmacist

## 2021-03-16 DIAGNOSIS — F411 Generalized anxiety disorder: Secondary | ICD-10-CM

## 2021-03-16 DIAGNOSIS — F339 Major depressive disorder, recurrent, unspecified: Secondary | ICD-10-CM

## 2021-03-16 NOTE — BH Specialist Note (Signed)
Integrated Behavioral Health Follow Up In-Person Visit  MRN: [id number] Name: Cameron [name]   Total time: 15 minutes  Types of Service: Telephone visit Patient consents to telephone visit and 2 patient identifiers were used to identify patient  Interpretor:No. Interpretor Name and Language:  Subjective: Cameron [name] is a 50 y.o. male accompanied by himself Patient was referred by [name], NP for mental health Patient reports the following symptoms/concerns: The patient reports that he is doing okay. He stated that he is on his break at work and will not be able to talk for long. He discussed his stress levels. He discussed that he needs medication refills. He notes that his dosage of sertraline (ZOLOFT) 50 MG tablet was not increased to 100 MG yet. He requested that clinician send a message to his provider. He reported that he is doing well on his job. He stated that everything else has been the same. The patient denied any suicidal or homicidal thoughts.    Duration of problem: ; Severity of problem: moderate  Objective: Mood: Euthymic and Affect: Appropriate Risk of harm to self or others: No plan to harm self or others  Life Context: Family and Social: see above School/Work: see above Self-Care: see above Life Changes: see above  Patient and/or Family's Strengths/Protective Factors: Concrete supports in place (healthy food, safe environments, etc.)  Goals Addressed: Patient will: 1.  Reduce symptoms of: agitation, anxiety, depression, insomnia and stress  2.  Increase knowledge and/or ability of: coping skills, healthy habits, self-management skills and stress reduction  3.  Demonstrate ability to: Increase healthy adjustment to current life circumstances  Progress towards Goals: Ongoing  Interventions: Interventions utilized:  Supportive Counseling was utilized by the clinician during today's follow up session. The clinician processed with the  patient how they have been doing since the last follow-up session. The clinician provided a space for the patient to ventilate their frustrations regarding their current life circumstances. Clinician measured the patient's anxiety and depression on a numerical scale. Clinician provided patient with [facility] contact information.  Standardized Assessments completed: GAD-7 and PHQ 9  Gad-7     14 PHQ-9    17  Patient and/or Family Response:  Patient was agreeable to the plans and treatment recommendations.  Patient Centered Plan: Patient is on the following Treatment Plan(s): Patient will follow up with [facility] until he can be rescheduled at [facility] Assessment: Patient currently experiencing see above   Patient may benefit from see above  Plan: 1. Follow up with behavioral health clinician on :  2. Behavioral recommendations:  3. Referral(s): Integrated [employer] (In Clinic) and [facility] (LME/Outside Clinic) 4. "From scale of 1-10, how likely are you to follow plan?":   Judith Part, Student-Social Work

## 2021-03-16 NOTE — Telephone Encounter (Signed)
03/16/2021 3:24:52 PM - ProAir printout rec'd to pt & dr  -- Rhetta Mura - Tuesday, March 16, 2021 3:22 PM --Received pharmacy printout for ProAir Inhale 2 puffs into the lungs every 4 hours as needed for wheezing or shortness of breath (replaces Ventolin) Mailing patient his portion of [employer] application to sign & return, Also patient needs to complete Re-Certification packet & return before ordering this med--Patient [facility] elig. ends 03/21/21, sending [facility] their portion for [facility] to sign.

## 2021-03-25 ENCOUNTER — Other Ambulatory Visit: Payer: Self-pay

## 2021-04-01 ENCOUNTER — Ambulatory Visit: Payer: Self-pay

## 2021-04-09 ENCOUNTER — Other Ambulatory Visit: Payer: Self-pay

## 2021-04-30 ENCOUNTER — Other Ambulatory Visit: Payer: Self-pay

## 2021-05-03 ENCOUNTER — Other Ambulatory Visit: Payer: Self-pay

## 2021-05-03 ENCOUNTER — Other Ambulatory Visit: Payer: Self-pay | Admitting: Gerontology

## 2021-05-03 DIAGNOSIS — J45909 Unspecified asthma, uncomplicated: Secondary | ICD-10-CM

## 2021-05-03 DIAGNOSIS — F339 Major depressive disorder, recurrent, unspecified: Secondary | ICD-10-CM

## 2021-05-03 DIAGNOSIS — Z8659 Personal history of other mental and behavioral disorders: Secondary | ICD-10-CM

## 2021-05-04 ENCOUNTER — Other Ambulatory Visit: Payer: Self-pay

## 2021-05-04 MED ORDER — AMLODIPINE BESYLATE 5 MG PO TABS
ORAL_TABLET | Freq: Every day | ORAL | 0 refills | Status: DC
  Filled 2021-05-04: qty 30, fill #0

## 2021-05-04 MED ORDER — TRAZODONE HCL 50 MG PO TABS
50.0000 mg | ORAL_TABLET | Freq: Every day | ORAL | 0 refills | Status: DC
  Filled 2021-05-04: qty 15, 15d supply, fill #0

## 2021-05-04 MED ORDER — ALBUTEROL SULFATE HFA 108 (90 BASE) MCG/ACT IN AERS
2.0000 | INHALATION_SPRAY | RESPIRATORY_TRACT | 3 refills | Status: DC | PRN
  Filled 2021-05-04 – 2021-05-31 (×2): qty 6.7, 17d supply, fill #0
  Filled 2021-07-01: qty 6.7, 17d supply, fill #1
  Filled 2021-08-04: qty 6.7, 17d supply, fill #2
  Filled 2021-09-20: qty 6.7, 17d supply, fill #3

## 2021-05-04 MED ORDER — SERTRALINE HCL 50 MG PO TABS
100.0000 mg | ORAL_TABLET | Freq: Every day | ORAL | 0 refills | Status: DC
  Filled 2021-05-04: qty 30, 15d supply, fill #0

## 2021-05-05 ENCOUNTER — Telehealth: Payer: Self-pay | Admitting: Pharmacy Technician

## 2021-05-05 NOTE — Telephone Encounter (Signed)
Patient called and spoke with Kayla-Pharmacy Technician requesting a refill on medications.  Dorathy Daft explained that patient had failed to provide 2022 re-certification documentation.  Patient stated that he did not know that he needed to re-certify.  Kayla asked me to contact patient.  I attempted to contact patient via phone.  Unable to reach patient.  Left message.  Mailing another re-certification packet to patient.  Sherilyn Dacosta Care Manager [facility]

## 2021-05-06 ENCOUNTER — Telehealth: Payer: Self-pay | Admitting: Gerontology

## 2021-05-06 NOTE — Telephone Encounter (Signed)
LVM regarding scheduling a lab appt, a follow up appt, and an appt with Ms. Rhett Bannister on 05/06/21.

## 2021-05-11 NOTE — Telephone Encounter (Signed)
Scheduled lab appt on 6/29 at 10:15 am, f/u on 7/5 at 1:30 pm, and f/u with Heather on 7/14 at 10 am. Called and left voicemail about these appts.  - RM

## 2021-05-16 IMAGING — CR DG ANKLE COMPLETE 3+V*R*
1 series · 3 of 3 positions shown · non-contrast
Comparison: None.

CLINICAL DATA: Twisted ankle falling into pothole

EXAM:
RIGHT ANKLE - COMPLETE 3+ VIEW

[Series 1: dg ankle complete right · 0.14mm/px · 3 of 3 slices shown]
[im 1/3]
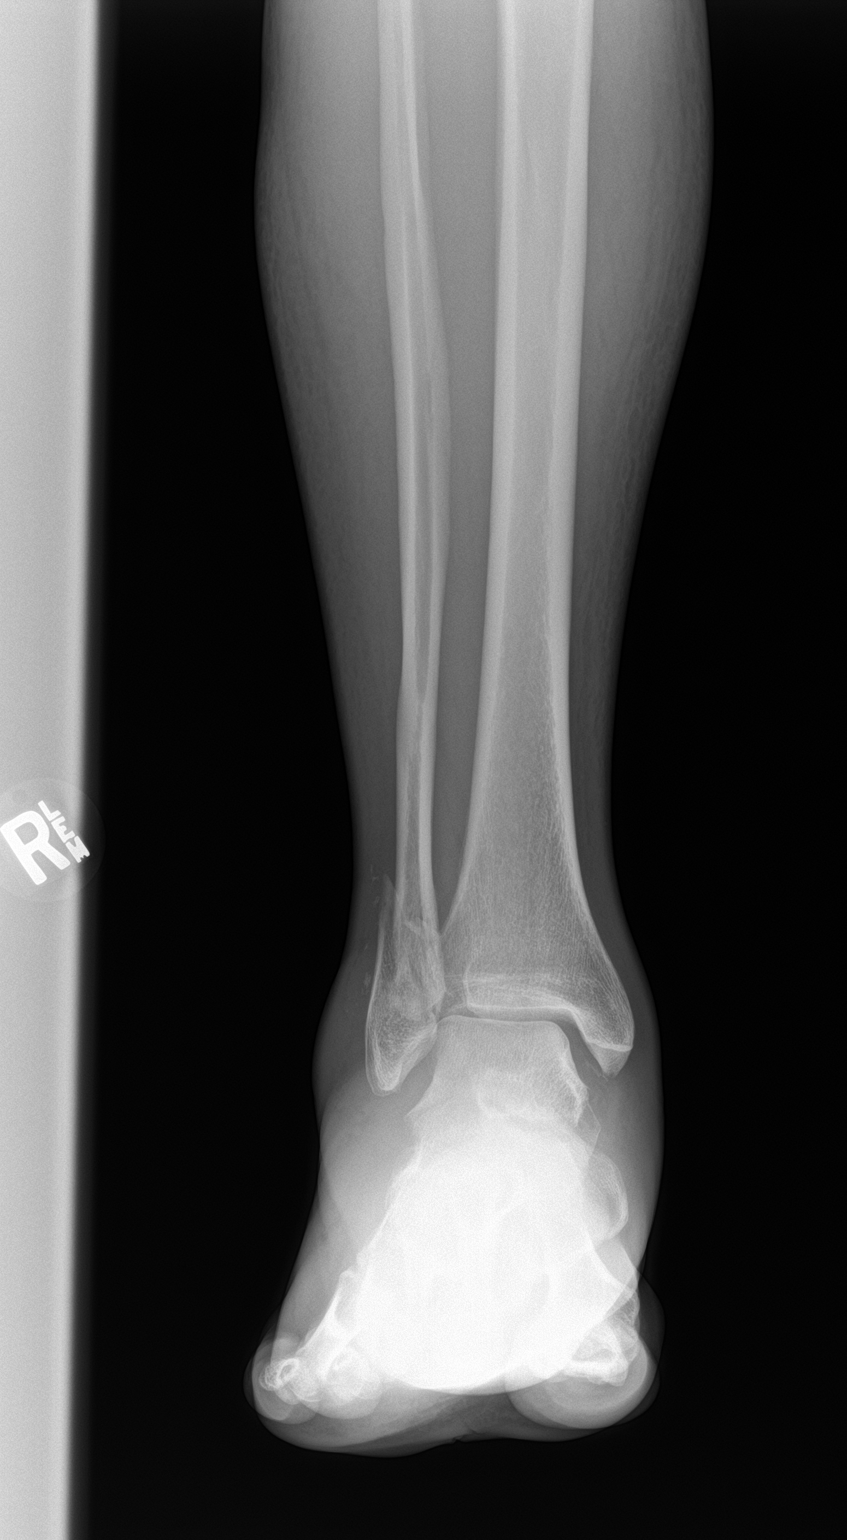
[im 2/3]
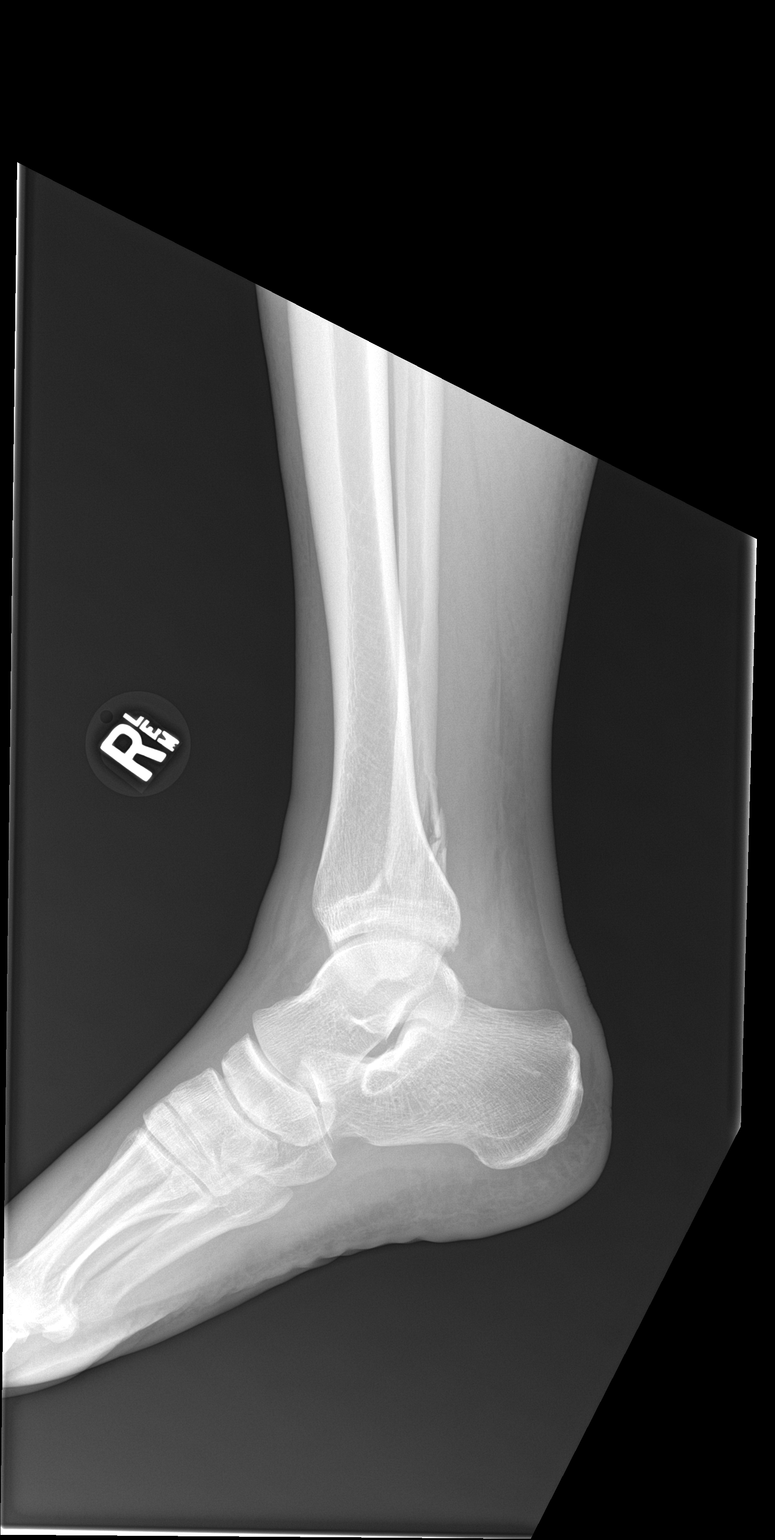
[im 3/3]
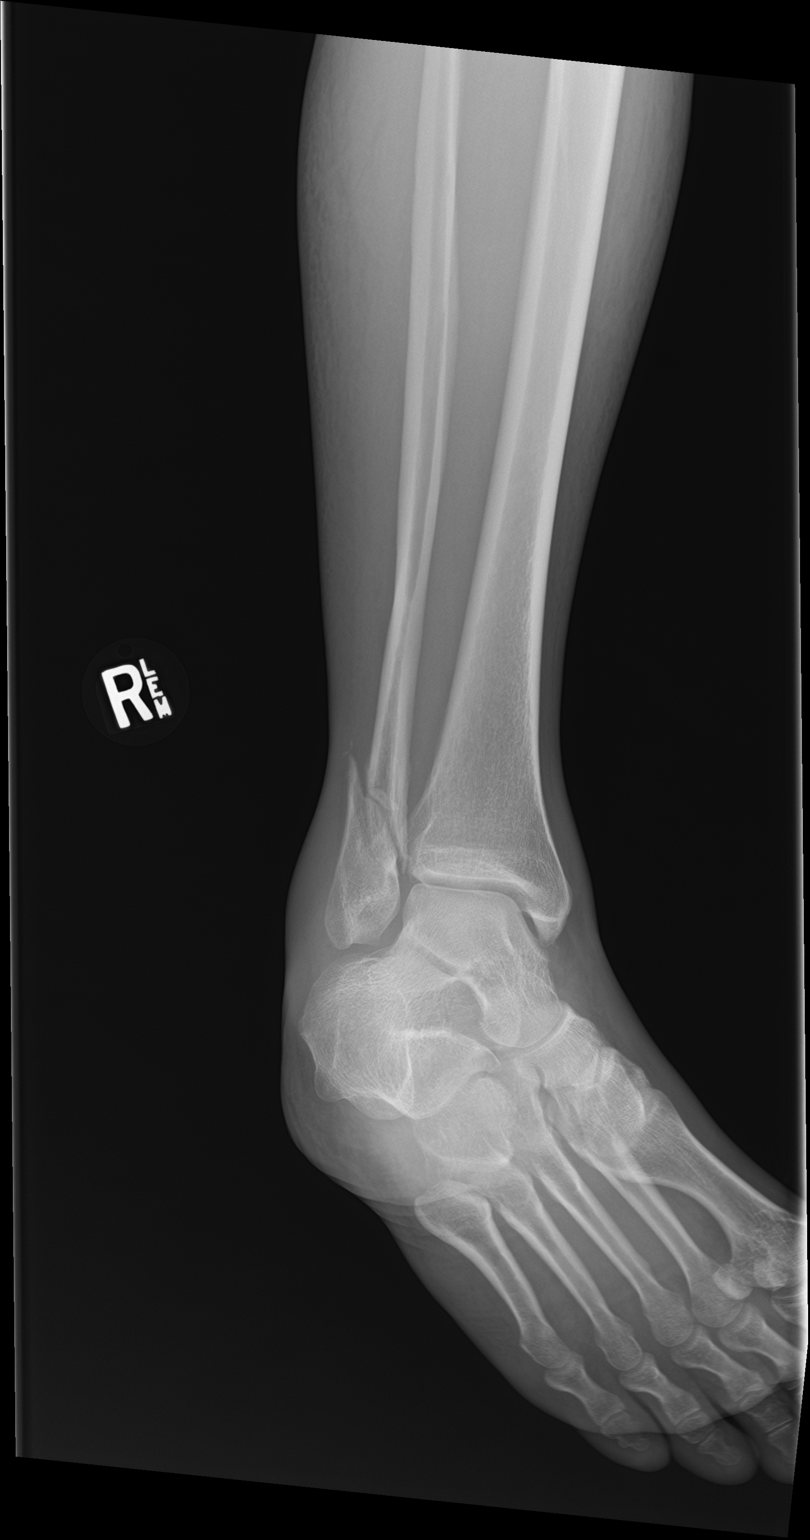

[3 of 3 positions shown; findings below may reference images not displayed]

FINDINGS: Obliquely oriented, mildly comminuted fracture trans syndesmotic
fracture of the distal fibula (Weber B). Additional osseous defect
at the tip of the medial malleolus may reflect additional avulsion.
There is slight lateral talar shift. Circumferential soft tissue
swelling of the ankle is present with a moderate effusion.
IMPRESSION: 1. Obliquely oriented, mildly comminuted trans syndesmotic fracture
of the distal fibula with slight lateral talar shift.
2. Osseous defect at the tip of the medial malleolus may reflect
additional avulsion.
3. Combination of features are compatible with a Weber B stage IV
supination external rotation injury.
4. Associated swelling and ankle effusion.

## 2021-05-19 ENCOUNTER — Other Ambulatory Visit: Payer: Self-pay

## 2021-05-25 ENCOUNTER — Other Ambulatory Visit: Payer: Self-pay

## 2021-05-25 ENCOUNTER — Ambulatory Visit: Payer: Self-pay | Admitting: Gerontology

## 2021-05-25 ENCOUNTER — Encounter: Payer: Self-pay | Admitting: Gerontology

## 2021-05-25 VITALS — BP 113/79 | HR 96 | Temp 97.4°F | Resp 16 | Ht 67.0 in | Wt 165.1 lb

## 2021-05-25 DIAGNOSIS — R000 Tachycardia, unspecified: Secondary | ICD-10-CM

## 2021-05-25 DIAGNOSIS — Z8659 Personal history of other mental and behavioral disorders: Secondary | ICD-10-CM

## 2021-05-25 DIAGNOSIS — I10 Essential (primary) hypertension: Secondary | ICD-10-CM

## 2021-05-25 DIAGNOSIS — F339 Major depressive disorder, recurrent, unspecified: Secondary | ICD-10-CM

## 2021-05-25 MED ORDER — SERTRALINE HCL 100 MG PO TABS
100.0000 mg | ORAL_TABLET | Freq: Every day | ORAL | 0 refills | Status: DC
  Filled 2021-05-25 – 2021-05-31 (×2): qty 14, 14d supply, fill #0

## 2021-05-25 MED ORDER — AMLODIPINE BESYLATE 5 MG PO TABS
ORAL_TABLET | Freq: Every day | ORAL | 3 refills | Status: DC
Start: 2021-05-25 — End: 2021-11-01
  Filled 2021-05-25: qty 30, fill #0
  Filled 2021-05-31: qty 30, 30d supply, fill #0
  Filled 2021-07-01: qty 30, 30d supply, fill #1
  Filled 2021-08-04: qty 30, 30d supply, fill #2
  Filled 2021-09-20: qty 30, 30d supply, fill #3

## 2021-05-25 MED ORDER — TRAZODONE HCL 50 MG PO TABS
50.0000 mg | ORAL_TABLET | Freq: Every day | ORAL | 0 refills | Status: DC
  Filled 2021-05-25 – 2021-05-31 (×2): qty 15, 15d supply, fill #0

## 2021-05-25 NOTE — Progress Notes (Signed)
Established Patient Office Visit  Subjective:  Patient ID: Cameron [name], male    DOB: 1971-09-11  Age: 50 y.o. MRN: [id number]  CC:  Chief Complaint  Patient presents with   Follow-up   Hypertension   Medication Refill    HPI Cameron [name] is a 50 y/o male who has history of Allergy, Asthma, Depression ,presents for routine follow up visit and medication refill. He states that he's compliant with his medications and continues to make healthy lifestyle changes. His heart rate was elevated during visit, he denies chest pain, palpitation, dizziness and shortness of breath. He states that his mood is dysphoric, but denies suicidal nor homicidal ideation. Overall, he states that he's doing well and offers no further complaint.  Past Medical History:  Diagnosis Date   Allergy    Asthma    Depression    Substance abuse (HCC)     Past Surgical History:  Procedure Laterality Date   NO PAST SURGERIES      Family History  Problem Relation Age of Onset   Hypertension Mother    Diabetes Father    CAD Father        CABG x 4   Healthy Sister    Heart attack Maternal Grandmother    Cancer Maternal Grandfather    Other Paternal Grandmother        unknown medical history   Heart attack Paternal Grandfather     Social History   Socioeconomic History   Marital status: Single    Spouse name: Not on file   Number of children: Not on file   Years of education: Not on file   Highest education level: Not on file  Occupational History   Occupation: unemployed  Tobacco Use   Smoking status: Never   Smokeless tobacco: Current    Types: Chew  Vaping Use   Vaping Use: Never used  Substance and Sexual Activity   Alcohol use: Yes    Alcohol/week: 14.0 standard drinks    Types: 14 Cans of beer per week    Comment: 1-2 beers a day and 2-4 beers at day on the weekends   Drug use: Not Currently    Types: Cocaine    Comment: last use 2010   Sexual activity: Not on file   Other Topics Concern   Not on file  Social History Narrative   Social determinants screening completed 02/18/2020. Patient has transportation and is in stable housing. He is working with a temp agency to find work. HS   Social Determinants of Health   Financial Resource Strain: Not on file  Food Insecurity: No Food Insecurity   Worried About Programme researcher, broadcasting/film/video in the Last Year: Never true   Ran Out of Food in the Last Year: Never true  Transportation Needs: No Transportation Needs   Lack of Transportation (Medical): No   Lack of Transportation (Non-Medical): No  Physical Activity: Not on file  Stress: Not on file  Social Connections: Not on file  Intimate Partner Violence: Not on file    Outpatient Medications Prior to Visit  Medication Sig Dispense Refill   albuterol (VENTOLIN HFA) 108 (90 Base) MCG/ACT inhaler INHALE 2 PUFFS INTO THE LUNGS EVERY 4 HOURS AS NEEDED FOR WHEEZING OR SHORTNESS OF BREATH 6.7 g 3   cetirizine (ZYRTEC) 10 MG tablet Take 1 tablet (10 mg total) by mouth daily. 60 tablet 0   amLODipine (NORVASC) 5 MG tablet TAKE ONE TABLET BY MOUTH  EVERY DAY 30 tablet 0   sertraline (ZOLOFT) 50 MG tablet Take 2 tablets (100 mg total) by mouth daily. 30 tablet 0   diclofenac Sodium (VOLTAREN) 1 % GEL Apply 2 g topically 4 (four) times daily as needed. 50 g 0   fluticasone (FLOVENT HFA) 110 MCG/ACT inhaler Inhale 1 puff into the lungs 2 (two) times daily. (Patient not taking: Reported on 10/22/2020) 1 Inhaler 12   traZODone (DESYREL) 50 MG tablet Take 1 tablet (50 mg total) by mouth at bedtime. (Patient not taking: Reported on 05/25/2021) 15 tablet 0   No facility-administered medications prior to visit.    No Known Allergies  ROS Review of Systems  Constitutional: Negative.   Respiratory: Negative.    Cardiovascular: Negative.   Skin: Negative.   Neurological: Negative.   Psychiatric/Behavioral:  Positive for dysphoric mood.      Objective:    Physical Exam HENT:      Head: Normocephalic and atraumatic.  Cardiovascular:     Rate and Rhythm: Normal rate and regular rhythm.     Pulses: Normal pulses.     Heart sounds: Normal heart sounds.  Pulmonary:     Effort: Pulmonary effort is normal.     Breath sounds: Normal breath sounds.  Skin:    General: Skin is warm.  Neurological:     General: No focal deficit present.     Mental Status: He is alert and oriented to person, place, and time. Mental status is at baseline.  Psychiatric:        Mood and Affect: Mood normal.        Behavior: Behavior normal.        Thought Content: Thought content normal.        Judgment: Judgment normal.    BP 113/79 (BP Location: Right Arm, Patient Position: Sitting, Cuff Size: Large)   Pulse 96   Temp (!) 97.4 F (36.3 C)   Resp 16   Ht 5\' 7"  (1.702 m)   Wt 165 lb 1.6 oz (74.9 kg)   SpO2 97%   BMI 25.86 kg/m  Wt Readings from Last 3 Encounters:  05/25/21 165 lb 1.6 oz (74.9 kg)  02/23/21 175 lb (79.4 kg)  10/22/20 175 lb (79.4 kg)     Health Maintenance Due  Topic Date Due   Pneumococcal Vaccine 39-16 Years old (1 - PCV) Never done   HIV Screening  Never done   Hepatitis C Screening  Never done   COLONOSCOPY (Pts 45-71yrs Insurance coverage will need to be confirmed)  Never done   TETANUS/TDAP  06/26/2019   COVID-19 Vaccine (3 - Pfizer risk series) 05/12/2020    There are no preventive care reminders to display for this patient.  Lab Results  Component Value Date   TSH 2.370 01/03/2019   Lab Results  Component Value Date   WBC 7.2 01/03/2019   HGB 15.7 01/03/2019   HCT 46.3 01/03/2019   MCV 90 01/03/2019   PLT 243 01/03/2019   Lab Results  Component Value Date   NA 140 01/03/2019   K 4.6 01/03/2019   CO2 25 01/03/2019   GLUCOSE 83 01/03/2019   BUN 9 01/03/2019   CREATININE 1.09 01/03/2019   BILITOT 0.4 01/03/2019   ALKPHOS 63 01/03/2019   AST 21 01/03/2019   ALT 16 01/03/2019   PROT 7.0 01/03/2019   ALBUMIN 4.6 01/03/2019    CALCIUM 9.5 01/03/2019   Lab Results  Component Value Date   CHOL 132 01/03/2019  Lab Results  Component Value Date   HDL 37 (L) 01/03/2019   Lab Results  Component Value Date   LDLCALC 74 01/03/2019   Lab Results  Component Value Date   TRIG 104 01/03/2019   Lab Results  Component Value Date   CHOLHDL 3.6 01/03/2019   Lab Results  Component Value Date   HGBA1C 5.4 01/03/2019      Assessment & Plan:    1. History of anxiety -He will continue on his current medication, will follow up with [facility]. He was advised to call Crisis help line with worsening symptoms. - sertraline (ZOLOFT) 100 MG tablet; Take 1 tablet (100 mg total) by mouth daily.  Dispense: 14 tablet; Refill: 0 - traZODone (DESYREL) 50 MG tablet; Take 1 tablet (50 mg total) by mouth at bedtime.  Dispense: 15 tablet; Refill: 0  2. Depression, recurrent (HCC) -Same as #1 - sertraline (ZOLOFT) 100 MG tablet; Take 1 tablet (100 mg total) by mouth daily.  Dispense: 14 tablet; Refill: 0 - traZODone (DESYREL) 50 MG tablet; Take 1 tablet (50 mg total) by mouth at bedtime.  Dispense: 15 tablet; Refill: 0  3. Essential hypertension -His blood pressure is under control, he will continue on current medication, DASH diet and exercise as tolerated. - amLODipine (NORVASC) 5 MG tablet; TAKE ONE TABLET BY MOUTH EVERY DAY  Dispense: 30 tablet; Refill: 3  4. Tachycardia -His heart rate was rechecked after drinking a bottle of water and it decreased to 96 b.p.m. He was advised to increase water intake and notify the clinic with worsening symptoms.     Follow-up: Return in about 3 months (around 09/01/2021), or if symptoms worsen or fail to improve.    Demetries Coia Trellis Paganini, NP

## 2021-05-25 NOTE — Patient Instructions (Signed)
https://www.nhlbi.nih.gov/files/docs/public/heart/dash_brief.pdf">  DASH Eating Plan DASH stands for Dietary Approaches to Stop Hypertension. The DASH eating plan is a healthy eating plan that has been shown to: Reduce high blood pressure (hypertension). Reduce your risk for type 2 diabetes, heart disease, and stroke. Help with weight loss. What are tips for following this plan? Reading food labels Check food labels for the amount of salt (sodium) per serving. Choose foods with less than 5 percent of the Daily Value of sodium. Generally, foods with less than 300 milligrams (mg) of sodium per serving fit into this eating plan. To find whole grains, look for the word "whole" as the first word in the ingredient list. Shopping Buy products labeled as "low-sodium" or "no salt added." Buy fresh foods. Avoid canned foods and pre-made or frozen meals. Cooking Avoid adding salt when cooking. Use salt-free seasonings or herbs instead of table salt or sea salt. Check with your health care provider or pharmacist before using salt substitutes. Do not fry foods. Cook foods using healthy methods such as baking, boiling, grilling, roasting, and broiling instead. Cook with heart-healthy oils, such as olive, canola, avocado, soybean, or sunflower oil. Meal planning  Eat a balanced diet that includes: 4 or more servings of fruits and 4 or more servings of vegetables each day. Try to fill one-half of your plate with fruits and vegetables. 6-8 servings of whole grains each day. Less than 6 oz (170 g) of lean meat, poultry, or fish each day. A 3-oz (85-g) serving of meat is about the same size as a deck of cards. One egg equals 1 oz (28 g). 2-3 servings of low-fat dairy each day. One serving is 1 cup (237 mL). 1 serving of nuts, seeds, or beans 5 times each week. 2-3 servings of heart-healthy fats. Healthy fats called omega-3 fatty acids are found in foods such as walnuts, flaxseeds, fortified milks, and eggs.  These fats are also found in cold-water fish, such as sardines, salmon, and mackerel. Limit how much you eat of: Canned or prepackaged foods. Food that is high in trans fat, such as some fried foods. Food that is high in saturated fat, such as fatty meat. Desserts and other sweets, sugary drinks, and other foods with added sugar. Full-fat dairy products. Do not salt foods before eating. Do not eat more than 4 egg yolks a week. Try to eat at least 2 vegetarian meals a week. Eat more home-cooked food and less restaurant, buffet, and fast food.  Lifestyle When eating at a restaurant, ask that your food be prepared with less salt or no salt, if possible. If you drink alcohol: Limit how much you use to: 0-1 drink a day for women who are not pregnant. 0-2 drinks a day for men. Be aware of how much alcohol is in your drink. In the [place] one drink equals one 12 oz bottle of beer (355 mL), one 5 oz glass of wine (148 mL), or one 1 oz glass of hard liquor (44 mL). General information Avoid eating more than 2,300 mg of salt a day. If you have hypertension, you may need to reduce your sodium intake to 1,500 mg a day. Work with your health care provider to maintain a healthy body weight or to lose weight. Ask what an ideal weight is for you. Get at least 30 minutes of exercise that causes your heart to beat faster (aerobic exercise) most days of the week. Activities may include walking, swimming, or biking. Work with your health care provider   or dietitian to adjust your eating plan to your individual calorie needs. What foods should I eat? Fruits All fresh, dried, or frozen fruit. Canned fruit in natural juice (without addedsugar). Vegetables Fresh or frozen vegetables (raw, steamed, roasted, or grilled). Low-sodium or reduced-sodium tomato and vegetable juice. Low-sodium or reduced-sodium tomatosauce and tomato paste. Low-sodium or reduced-sodium canned vegetables. Grains Whole-grain or  whole-wheat bread. Whole-grain or whole-wheat pasta. Brown rice. Oatmeal. Quinoa. Bulgur. Whole-grain and low-sodium cereals. Pita bread.Low-fat, low-sodium crackers. Whole-wheat flour tortillas. Meats and other proteins Skinless chicken or turkey. Ground chicken or turkey. Pork with fat trimmed off. Fish and seafood. Egg whites. Dried beans, peas, or lentils. Unsalted nuts, nut butters, and seeds. Unsalted canned beans. Lean cuts of beef with fat trimmed off. Low-sodium, lean precooked or cured meat, such as sausages or meatloaves. Dairy Low-fat (1%) or fat-free (skim) milk. Reduced-fat, low-fat, or fat-free cheeses. Nonfat, low-sodium ricotta or cottage cheese. Low-fat or nonfatyogurt. Low-fat, low-sodium cheese. Fats and oils Soft margarine without trans fats. Vegetable oil. Reduced-fat, low-fat, or light mayonnaise and salad dressings (reduced-sodium). Canola, safflower, olive, avocado, soybean, andsunflower oils. Avocado. Seasonings and condiments Herbs. Spices. Seasoning mixes without salt. Other foods Unsalted popcorn and pretzels. Fat-free sweets. The items listed above may not be a complete list of foods and beverages you can eat. Contact a dietitian for more information. What foods should I avoid? Fruits Canned fruit in a light or heavy syrup. Fried fruit. Fruit in cream or buttersauce. Vegetables Creamed or fried vegetables. Vegetables in a cheese sauce. Regular canned vegetables (not low-sodium or reduced-sodium). Regular canned tomato sauce and paste (not low-sodium or reduced-sodium). Regular tomato and vegetable juice(not low-sodium or reduced-sodium). Pickles. Olives. Grains Baked goods made with fat, such as croissants, muffins, or some breads. Drypasta or rice meal packs. Meats and other proteins Fatty cuts of meat. Ribs. Fried meat. Bacon. Bologna, salami, and other precooked or cured meats, such as sausages or meat loaves. Fat from the back of a pig (fatback). Bratwurst.  Salted nuts and seeds. Canned beans with added salt. Canned orsmoked fish. Whole eggs or egg yolks. Chicken or turkey with skin. Dairy Whole or 2% milk, cream, and half-and-half. Whole or full-fat cream cheese. Whole-fat or sweetened yogurt. Full-fat cheese. Nondairy creamers. Whippedtoppings. Processed cheese and cheese spreads. Fats and oils Butter. Stick margarine. Lard. Shortening. Ghee. Bacon fat. Tropical oils, suchas coconut, palm kernel, or palm oil. Seasonings and condiments Onion salt, garlic salt, seasoned salt, table salt, and sea salt. Worcestershire sauce. Tartar sauce. Barbecue sauce. Teriyaki sauce. Soy sauce, including reduced-sodium. Steak sauce. Canned and packaged gravies. Fish sauce. Oyster sauce. Cocktail sauce. Store-bought horseradish. Ketchup. Mustard. Meat flavorings and tenderizers. Bouillon cubes. Hot sauces. Pre-made or packaged marinades. Pre-made or packaged taco seasonings. Relishes. Regular saladdressings. Other foods Salted popcorn and pretzels. The items listed above may not be a complete list of foods and beverages you should avoid. Contact a dietitian for more information. Where to find more information National Heart, Lung, and Blood Institute: www.nhlbi.nih.gov American Heart Association: www.heart.org Academy of Nutrition and Dietetics: www.eatright.org National Kidney Foundation: www.kidney.org Summary The DASH eating plan is a healthy eating plan that has been shown to reduce high blood pressure (hypertension). It may also reduce your risk for type 2 diabetes, heart disease, and stroke. When on the DASH eating plan, aim to eat more fresh fruits and vegetables, whole grains, lean proteins, low-fat dairy, and heart-healthy fats. With the DASH eating plan, you should limit salt (sodium) intake to 2,300   mg a day. If you have hypertension, you may need to reduce your sodium intake to 1,500 mg a day. Work with your health care provider or dietitian to adjust  your eating plan to your individual calorie needs. This information is not intended to replace advice given to you by your health care provider. Make sure you discuss any questions you have with your healthcare provider. Document Revised: 10/11/2019 Document Reviewed: 10/11/2019 Elsevier Patient Education  2022 Elsevier Inc.  

## 2021-05-31 ENCOUNTER — Other Ambulatory Visit: Payer: Self-pay

## 2021-06-03 ENCOUNTER — Ambulatory Visit: Payer: Self-pay | Admitting: Licensed Clinical Social Worker

## 2021-06-03 ENCOUNTER — Other Ambulatory Visit: Payer: Self-pay

## 2021-06-03 DIAGNOSIS — F339 Major depressive disorder, recurrent, unspecified: Secondary | ICD-10-CM

## 2021-06-03 DIAGNOSIS — F411 Generalized anxiety disorder: Secondary | ICD-10-CM

## 2021-06-03 NOTE — BH Specialist Note (Signed)
Integrated Behavioral Health Follow Up In-Person Visit  MRN: [id number] Name: Cameron [name]   Total time: 60 minutes  Types of Service: Telephone visit Patient consents to telephone visit and 2 patient identifiers were used to identify patient   Interpretor:No. Interpretor Name and Language: N/A  Subjective: Cameron [name] is a 50 y.o. male accompanied by  Himself Patient was referred by [name], NP for mental health. Patient reports the following symptoms/concerns: The patient reports he has had good days and bad days since his last follow-up session. 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 He states that he plans on putting in more job applications but is struggling with low motivation and energy levels. He notes that he is experiencing increased difficulty falling asleep and would like to increase his Trazodone dosage. He reports that he has to sleep with the television on but turns over so the light is not in his face. He stated that he tries to go to bed by 11:30 PM. The patient discussed financial stressors in his life and feeling like he has let him mother down by losing his job.He discussed barriers to getting his medications refilled consistently. He reported that he is also worrying about his dog because she has a growth that has developed over her eye and he is unable to afford the vet fees for removing it. He noted that his dog is certified as his emotional support animal.The patient noted that he has not been practicing self care and feels drained. The patient denied any suicidal or homicidal thoughts.  Duration of problem:; Severity of problem: moderate  Objective: Mood: Euthymic and Affect: Appropriate Risk of harm to self or others: No plan to harm self or others  Life Context: Family and Social: see above  School/Work: see above Self-Care:  see above Life Changes: see above  Patient and/or Family's Strengths/Protective Factors: Concrete supports in place (healthy food, safe environments, etc.)  Goals Addressed: Patient will:  Reduce symptoms of: anxiety, depression, insomnia, and stress   Increase knowledge and/or ability of: coping skills, healthy habits, and stress reduction   Demonstrate ability to: Increase healthy adjustment to current life circumstances  Progress towards Goals: Ongoing  Interventions: Interventions utilized:  CBT Cognitive Behavioral Therapy and Sleep Hygiene was utilized by the clinician during today's follow up session. The clinician processed with the patient how they have been doing since the last follow-up session. The clinician provided a space for the patient to ventilate their frustrations regarding their current life circumstances. Clinician measured the patient's anxiety and depression on a numerical scale. Clinician reviewed sleep hygiene with the patient and provided him several resources on sleep.  Clinician informed patient she would present his case for consultation with Dr. Mare Ferrari, MD, Psychiatric Consultant and [name], NP  on Thursday 06/15/2021 at 11:00 AM.Further the clinician encouraged the patient to utilize their coping skills to deal with their current life circumstances.   Standardized Assessments completed: GAD-7 and PHQ 9 GAD-7       15 PHQ-9       15  Assessment: Patient currently experiencing see above.   Patient may benefit from see above.  Plan: Follow up with behavioral health clinician on : 06/16/2021 at 5:00 PM  Behavioral recommendations:  Referral(s): [facility] (In Clinic) "From scale of 1-10, how likely are you  to follow plan?":   Lesli Albee, LCSWA

## 2021-06-11 ENCOUNTER — Other Ambulatory Visit: Payer: Self-pay

## 2021-06-15 ENCOUNTER — Other Ambulatory Visit: Payer: Self-pay | Admitting: Gerontology

## 2021-06-15 ENCOUNTER — Other Ambulatory Visit: Payer: Self-pay

## 2021-06-15 DIAGNOSIS — F339 Major depressive disorder, recurrent, unspecified: Secondary | ICD-10-CM

## 2021-06-15 DIAGNOSIS — Z8659 Personal history of other mental and behavioral disorders: Secondary | ICD-10-CM

## 2021-06-15 MED ORDER — TRAZODONE HCL 100 MG PO TABS
100.0000 mg | ORAL_TABLET | Freq: Every day | ORAL | 0 refills | Status: DC
  Filled 2021-06-15 – 2021-06-17 (×2): qty 30, 30d supply, fill #0

## 2021-06-16 ENCOUNTER — Ambulatory Visit: Payer: Self-pay | Admitting: Licensed Clinical Social Worker

## 2021-06-16 ENCOUNTER — Other Ambulatory Visit: Payer: Self-pay

## 2021-06-16 DIAGNOSIS — F339 Major depressive disorder, recurrent, unspecified: Secondary | ICD-10-CM

## 2021-06-16 DIAGNOSIS — Z8659 Personal history of other mental and behavioral disorders: Secondary | ICD-10-CM

## 2021-06-16 NOTE — BH Specialist Note (Signed)
Integrated Behavioral Health Follow Up In-Person Visit  MRN: [id number] Name: Cameron [name]   Total time: 60 minutes  Types of Service: Telephone visit Patient consents to telephone visit and 2 patient identifiers were used to identify patient   Interpretor:No. Interpretor Name and Language: N/A  Subjective: Cameron [name] is a 50 y.o. male accompanied by  himself Patient was referred by [name], NP for mental health. Patient reports the following symptoms/concerns: the patient stated that he has been doing about the same since his last follow-up visit. He notes he is having a hard time  falling asleep. He stated that he and his mother watch television together in the evenings and he goes to bed at 11:00 PM. He states he just lays there for over an hour before he falls asleep. He explained that he does sleep with his television on but he turns the volume down all the way and turns over and faces his wall not the television. He explained that his dog sleeps on the bed beside him and without her there he would never fall asleep. The patient discussed financial stressors impacting his life. He described feeling confident that he may have a job opportunity coming next week. He explained that this potential job and new schedule may make it tough to get time off for his appointments. He also noted that he contacted humane society to see if they could help pay for his dog to have a growth removed from her eye. The patient asked if his trazodone prescription could be written for a one month supply.The patient denied any suicidal or homicidal thoughts.  Duration of problem: Years; Severity of problem: moderate  Objective: Mood: Euthymic and Affect: Appropriate Risk of harm to self or others: No plan to harm self or others  Life Context: Family and Social: see above School/Work: see above Self-Care: see above Life Changes: see above  Patient and/or Family's Strengths/Protective  Factors: Concrete supports in place (healthy food, safe environments, etc.)  Goals Addressed: Patient will:  Reduce symptoms of: agitation, anxiety, depression, and stress   Increase knowledge and/or ability of: coping skills, healthy habits, self-management skills, and stress reduction   Demonstrate ability to: Increase healthy adjustment to current life circumstances and Increase adequate support systems for patient/family  Progress towards Goals: Ongoing  Interventions: Interventions utilized:  CBT Cognitive Behavioral Therapy was utilized by the clinician during today's follow up session. The clinician processed with the patient how they have been doing since the last follow-up session. The clinician provided a space for the patient to ventilate their frustrations regarding their current life circumstances. Clinician measured the patient's anxiety and depression on a numerical scale.  Clinician encouraged the patient to take their mediation at the same time everyday exactly as prescribed for it to reach it's full intended effect.   The clinician encouraged the patient to utilize their coping skills to deal with their current life circumstances.   Standardized Assessments completed: GAD-7 and PHQ 9 GAD -7       15 PHQ-9        18   Assessment: Patient currently experiencing see above.   Patient may benefit from see above.  Plan: Follow up with behavioral health clinician on : 07/08/2021 at 5:00 PM  Behavioral recommendations:  Referral(s): [facility] (In Clinic) "From scale of 1-10, how likely are you to follow plan?":   Judith Part, LCSWA

## 2021-06-17 ENCOUNTER — Other Ambulatory Visit: Payer: Self-pay

## 2021-06-17 ENCOUNTER — Telehealth: Payer: Self-pay

## 2021-06-17 ENCOUNTER — Other Ambulatory Visit: Payer: Self-pay | Admitting: Gerontology

## 2021-06-17 DIAGNOSIS — F339 Major depressive disorder, recurrent, unspecified: Secondary | ICD-10-CM

## 2021-06-17 DIAGNOSIS — Z8659 Personal history of other mental and behavioral disorders: Secondary | ICD-10-CM

## 2021-06-17 MED FILL — Sertraline HCl Tab 100 MG: ORAL | 30 days supply | Qty: 30 | Fill #0 | Status: AC

## 2021-06-17 NOTE — Telephone Encounter (Signed)
Social worker called patient and apologized to him for the experience he had when picking up his medication, assuring him that we are advocating to make improvements. Informed him that his prescription for sertraline (30 day supply) was sent over. Also asked patient to call so that social worker may assess needs and connect him to resources.

## 2021-06-18 ENCOUNTER — Other Ambulatory Visit: Payer: Self-pay

## 2021-06-21 ENCOUNTER — Other Ambulatory Visit: Payer: Self-pay

## 2021-06-22 ENCOUNTER — Other Ambulatory Visit: Payer: Self-pay

## 2021-06-30 NOTE — Telephone Encounter (Signed)
Social worker called patient to reschedule his appointment with the LCSWA and left a voice message asking him to call the clinic. Provided clinic phone number.

## 2021-06-30 NOTE — Telephone Encounter (Signed)
Social worker called patient to discuss what resources would be beneficial to him. Made an appointment with patient to begin to compose an e-mail to the [employer] in hopes that patient will be able to get assistance with the removal of a growth on his dog's eye.

## 2021-07-01 ENCOUNTER — Other Ambulatory Visit: Payer: Self-pay

## 2021-07-01 ENCOUNTER — Other Ambulatory Visit: Payer: Self-pay | Admitting: Gerontology

## 2021-07-01 ENCOUNTER — Ambulatory Visit: Payer: Self-pay

## 2021-07-01 DIAGNOSIS — F339 Major depressive disorder, recurrent, unspecified: Secondary | ICD-10-CM

## 2021-07-01 DIAGNOSIS — Z789 Other specified health status: Secondary | ICD-10-CM

## 2021-07-01 DIAGNOSIS — Z8659 Personal history of other mental and behavioral disorders: Secondary | ICD-10-CM

## 2021-07-01 MED ORDER — TRAZODONE HCL 100 MG PO TABS
100.0000 mg | ORAL_TABLET | Freq: Every day | ORAL | 0 refills | Status: DC
  Filled 2021-07-01 – 2021-08-04 (×2): qty 30, 30d supply, fill #0

## 2021-07-01 MED ORDER — SERTRALINE HCL 50 MG PO TABS
100.0000 mg | ORAL_TABLET | Freq: Every day | ORAL | 0 refills | Status: DC
  Filled 2021-07-01: qty 60, 30d supply, fill #0

## 2021-07-01 NOTE — Telephone Encounter (Signed)
Social worker called patient for 8:30 am appointment and assisted him with composing an e-mail for him to send to the [employer]. Also rescheduled his appointment with the LCSWA.  Social worker then called patient on 07-06-2021 to follow up on whether he had sent the [employer] e-mail and if he had received a response back from them.

## 2021-07-02 ENCOUNTER — Other Ambulatory Visit: Payer: Self-pay

## 2021-07-05 ENCOUNTER — Telehealth: Payer: Self-pay | Admitting: Pharmacist

## 2021-07-05 NOTE — Telephone Encounter (Signed)
07/05/2021 2:07:04 PM - [employer] updated app to pat. & dr  -- Rhetta Mura - Monday, July 05, 2021 2:05 PM --Today I have received the signed patient portion of [employer] application for [employer] HFA that was mailed to patient 03/16/2021- [employer] has updated their application so printed the updated and mailing patient his portion to sign & return, also sending Lanora Manis her portion to sign & return.

## 2021-07-05 NOTE — Telephone Encounter (Signed)
Patient approved for medication assistance at [facility] until 03/21/22, as long as eligibility criteria continues to be met.   Cameron [name] [facility] Administrative Assistant 

## 2021-07-07 ENCOUNTER — Other Ambulatory Visit: Payer: Self-pay

## 2021-07-07 ENCOUNTER — Ambulatory Visit: Payer: Self-pay | Admitting: Licensed Clinical Social Worker

## 2021-07-07 DIAGNOSIS — F411 Generalized anxiety disorder: Secondary | ICD-10-CM

## 2021-07-07 DIAGNOSIS — F339 Major depressive disorder, recurrent, unspecified: Secondary | ICD-10-CM

## 2021-07-07 NOTE — BH Specialist Note (Signed)
Integrated Behavioral Health Follow Up In-Person Visit  MRN: [id number] Name: Cameron [name]   Total time: 60 minutes  Types of Service: Telephone visit  Interpretor:No. Interpretor Name and Language: N/A  Subjective: SHANDELL JALLOW [name] is a 50 y.o. male accompanied by  himself Patient was referred by [name] NP for mental health. Patient reports the following symptoms/concerns: The patient reports that he has been doing well since his last follow-up session. He shared that he had two job interviews this week and that made him feel hopeful. He explained that he feels bad about himself when he is not working and that his depression and anxiety are better when he stays busy. He reported that he was able [facility] and [facility] his prescriptions for Trazodone 100 MG and Sertraline 10 MG. He explained that he is sleeping better but is still experiencing very low energy levels throughout his day. He  discuses several health and financial stressors that are impacting his life. He noted that he is working with a Child psychotherapist at [facility] [place] to find resources to help him. He stated that he is making some progress this week but following through with completing tasks has been a struggle. The patient denied any suicidal or homicidal thoughts.  Duration of problem: Years; Severity of problem: moderate  Objective: Mood: Depressed and Affect: Appropriate Risk of harm to self or others: No plan to harm self or others  Life Context: Family and Social: see above School/Work: see above Self-Care: see above Life Changes: see above  Patient and/or Family's Strengths/Protective Factors: Concrete supports in place (healthy food, safe environments, etc.)  Goals Addressed:// //Patient will:  Reduce symptoms of: agitation, anxiety, depression, insomnia, and stress   Increase knowledge and/or ability of: coping skills, healthy habits, self-management  skills, and stress reduction   Demonstrate ability to: Increase healthy adjustment to current life circumstances and Increase adequate support systems for patient/family  Progress towards Goals: Ongoing  Interventions: Interventions utilized:  CBT Cognitive Behavioral Therapywas utilized by the clinician during today's follow up session. The clinician processed with the patient how they have been doing since the last follow-up session. The clinician provided a space for the patient to ventilate their frustrations regarding their current life circumstances. Clinician measured the patient's anxiety and depression on a numerical scale.  Clinician encouraged the patient to take their mediation at the same time everyday exactly as prescribed for it to reach it's full intended effect. Clinician congratulated the patient on completing two job interviews and remaining encouraged and hopeful through the process.    Standardized Assessments completed: GAD-7 and PHQ 9 GAD-7    15 PHQ-9    16   Assessment: Patient currently experiencing see above.   Patient may benefit from see above.  Plan: Follow up with behavioral health clinician on : 07/27/2021 @ 5:00 PM  Behavioral recommendations:  Referral(s): [facility] [facility] (In Clinic) "From scale of 1-10, how likely are you to follow plan?":   Judith Part, LCSWA

## 2021-07-07 NOTE — Telephone Encounter (Signed)
Social worker called patient to remind him about his 5 pm appointment today with the LCSWA.

## 2021-07-07 NOTE — Telephone Encounter (Signed)
Patient called in to make an appointment with the Primary Care Provider. I also checked in with patient on his progress with sending the e-mail that we previously composed for the [employer].

## 2021-07-08 ENCOUNTER — Ambulatory Visit: Payer: Self-pay | Admitting: Licensed Clinical Social Worker

## 2021-07-08 ENCOUNTER — Other Ambulatory Visit: Payer: Self-pay

## 2021-07-09 ENCOUNTER — Telehealth: Payer: Self-pay | Admitting: Pharmacist

## 2021-07-09 NOTE — Progress Notes (Signed)
Social worker assisted patient with composing an e-mail to send to the [employer] in regards to the growth on the patient's dog's eye and social worker included contact email for person patient should send the email to at the [employer]. Patient knows that my last day is 07-08-2021.

## 2021-07-09 NOTE — Telephone Encounter (Signed)
07/09/2021 8:09:55 AM - [employer] pending  -- Rhetta Mura - Friday, July 09, 2021 8:09 AM --I have received the signed provider portion of [employer] application for [employer], holding for patient to return his portion, mailed to patient 07/05/2021.

## 2021-07-14 ENCOUNTER — Other Ambulatory Visit: Payer: Self-pay

## 2021-07-14 ENCOUNTER — Ambulatory Visit: Payer: Self-pay | Admitting: Gerontology

## 2021-07-14 ENCOUNTER — Encounter: Payer: Self-pay | Admitting: Gerontology

## 2021-07-14 VITALS — BP 133/87 | HR 95 | Temp 97.0°F | Resp 16 | Ht 67.0 in | Wt 172.4 lb

## 2021-07-14 DIAGNOSIS — K029 Dental caries, unspecified: Secondary | ICD-10-CM | POA: Insufficient documentation

## 2021-07-14 DIAGNOSIS — H6692 Otitis media, unspecified, left ear: Secondary | ICD-10-CM

## 2021-07-14 DIAGNOSIS — Z0000 Encounter for general adult medical examination without abnormal findings: Secondary | ICD-10-CM

## 2021-07-14 HISTORY — DX: Dental caries, unspecified: K02.9

## 2021-07-14 MED ORDER — AMOXICILLIN-POT CLAVULANATE 875-125 MG PO TABS
1.0000 | ORAL_TABLET | Freq: Two times a day (BID) | ORAL | 0 refills | Status: DC
  Filled 2021-07-14: qty 20, 10d supply, fill #0

## 2021-07-14 NOTE — Progress Notes (Signed)
Established Patient Office Visit  Subjective:  Patient ID: Cameron [name], male    DOB: 1971-01-12  Age: 50 y.o. MRN: [id number]  CC:  Chief Complaint  Patient presents with   Ear Pain    Patient c/o left ear pain x off & on x 8-9 months.    HPI Cameron [name] is a 50 y/o male who has history of Allergy, Asthma, Depression presents for c/o discomfort and sensation of fullness to his left ear. He denies dizziness, otalgia, hearing loss and tinnitus.  He states that his equilibrium is affected sometimes due to his left ear discomfort. He also reports having dental decay to his left upper wisdom tooth that has been going on for many months and does not have $20 for Dental referral. Overall, he states that he's doing well and offers no further complaint.   Past Medical History:  Diagnosis Date   Allergy    Asthma    Depression    Hypertension    Substance abuse ([place])     Past Surgical History:  Procedure Laterality Date   NO PAST SURGERIES      Family History  Problem Relation Age of Onset   Hypertension Mother    Diabetes Father    CAD Father        CABG x 4   Healthy Sister    Heart attack Maternal Grandmother    Cancer Maternal Grandfather    Other Paternal Grandmother        unknown medical history   Heart attack Paternal Grandfather     Social History   Socioeconomic History   Marital status: Single    Spouse name: Not on file   Number of children: Not on file   Years of education: Not on file   Highest education level: Not on file  Occupational History   Occupation: unemployed  Tobacco Use   Smoking status: Never   Smokeless tobacco: Current    Types: Chew  Vaping Use   Vaping Use: Never used  Substance and Sexual Activity   Alcohol use: Yes    Alcohol/week: 14.0 standard drinks    Types: 14 Cans of beer per week    Comment: 1-2 beers a day and 2-4 beers at day on the weekends   Drug use: Not Currently    Types: Cocaine    Comment:  last use 2010   Sexual activity: Not on file  Other Topics Concern   Not on file  Social History Narrative   Social determinants screening completed 02/18/2020. Patient has transportation and is in stable housing. He is working with a temp agency to find work. HS   Social Determinants of Health   Financial Resource Strain: Not on file  Food Insecurity: No Food Insecurity   Worried About Charity fundraiser in the Last Year: Never true   Ran Out of Food in the Last Year: Never true  Transportation Needs: No Transportation Needs   Lack of Transportation (Medical): No   Lack of Transportation (Non-Medical): No  Physical Activity: Not on file  Stress: Not on file  Social Connections: Not on file  Intimate Partner Violence: Not on file    Outpatient Medications Prior to Visit  Medication Sig Dispense Refill   albuterol (VENTOLIN HFA) 108 (90 Base) MCG/ACT inhaler INHALE 2 PUFFS INTO THE LUNGS EVERY 4 HOURS AS NEEDED FOR WHEEZING OR SHORTNESS OF BREATH 6.7 g 3   amLODipine (NORVASC) 5 MG tablet TAKE  ONE TABLET BY MOUTH EVERY DAY 30 tablet 3   cetirizine (ZYRTEC) 10 MG tablet Take 1 tablet (10 mg total) by mouth daily. 60 tablet 0   sertraline (ZOLOFT) 50 MG tablet Take 2 tablets (100 mg total) by mouth once daily. 60 tablet 0   traZODone (DESYREL) 100 MG tablet Take 1 tablet (100 mg total) by mouth once daily at bedtime. 30 tablet 0   No facility-administered medications prior to visit.    No Known Allergies  ROS Review of Systems  Constitutional: Negative.   HENT:  Positive for dental problem and ear pain.   Respiratory: Negative.    Cardiovascular: Negative.   Neurological: Negative.   Psychiatric/Behavioral: Negative.       Objective:    Physical Exam HENT:     Left Ear: No decreased hearing noted. No drainage or tenderness. A middle ear effusion (opacified tympanic membrane) is present. There is no impacted cerumen. No mastoid tenderness. Tympanic membrane is not  erythematous.     Mouth/Throat:     Dentition: Dental caries (to left upper wisdom tooth) present.  Cardiovascular:     Rate and Rhythm: Normal rate and regular rhythm.     Pulses: Normal pulses.     Heart sounds: Normal heart sounds.  Pulmonary:     Effort: Pulmonary effort is normal.     Breath sounds: Normal breath sounds.  Neurological:     Mental Status: He is alert.    BP 133/87 (BP Location: Left Arm, Patient Position: Sitting, Cuff Size: Large)   Pulse 95   Temp (!) 97 F (36.1 C)   Resp 16   Ht _0  (1.702 m)   Wt 172 lb 6.4 oz (78.2 kg)   SpO2 95%   BMI 27.00 kg/m  Wt Readings from Last 3 Encounters:  07/14/21 172 lb 6.4 oz (78.2 kg)  05/25/21 165 lb 1.6 oz (74.9 kg)  02/23/21 175 lb (79.4 kg)     Health Maintenance Due  Topic Date Due   Pneumococcal Vaccine 75-65 Years old (1 - PCV) Never done   HIV Screening  Never done   Hepatitis C Screening  Never done   COLONOSCOPY (Pts 45-47yr Insurance coverage will need to be confirmed)  Never done   TETANUS/TDAP  06/26/2019   COVID-19 Vaccine (3 - Pfizer risk series) 05/12/2020   INFLUENZA VACCINE  06/21/2021    There are no preventive care reminders to display for this patient.  Lab Results  Component Value Date   TSH 2.370 01/03/2019   Lab Results  Component Value Date   WBC 7.2 01/03/2019   HGB 15.7 01/03/2019   HCT 46.3 01/03/2019   MCV 90 01/03/2019   PLT 243 01/03/2019   Lab Results  Component Value Date   NA 140 01/03/2019   K 4.6 01/03/2019   CO2 25 01/03/2019   GLUCOSE 83 01/03/2019   BUN 9 01/03/2019   CREATININE 1.09 01/03/2019   BILITOT 0.4 01/03/2019   ALKPHOS 63 01/03/2019   AST 21 01/03/2019   ALT 16 01/03/2019   PROT 7.0 01/03/2019   ALBUMIN 4.6 01/03/2019   CALCIUM 9.5 01/03/2019   Lab Results  Component Value Date   CHOL 132 01/03/2019   Lab Results  Component Value Date   HDL 37 (L) 01/03/2019   Lab Results  Component Value Date   LDLCALC 74 01/03/2019   Lab  Results  Component Value Date   TRIG 104 01/03/2019   Lab Results  Component  Value Date   CHOLHDL 3.6 01/03/2019   Lab Results  Component Value Date   HGBA1C 5.4 01/03/2019      Assessment & Plan:   1. Healthcare maintenance -Routine labs will be checked - Urinalysis - HgB A1c - Testosterone - TSH - Lipid panel - Comp Met (CMET) - CBC w/Diff  2. Left otitis media, unspecified otitis media type -His left ear tympanic membrane is opacified, he will continue on Augmentin.  He was educated on medication side effect and advised to notify clinic or go to the emergency room. - amoxicillin-clavulanate (AUGMENTIN) 875-125 MG tablet; Take 1 tablet by mouth 2 (two) times daily.  Dispense: 20 tablet; Refill: 0  3. Tooth decay -He has left upper mandibular tooth decay, his $20 dental copayment was waived by the clinic and he will follow-up with a dentist.     Follow-up: Return in about 29 days (around 08/12/2021).    Rosamund Nyland Jerold Coombe, NP

## 2021-07-15 LAB — COMPREHENSIVE METABOLIC PANEL
ALT: 63 IU/L — ABNORMAL HIGH (ref 0–44)
AST: 173 IU/L — ABNORMAL HIGH (ref 0–40)
Albumin/Globulin Ratio: 1.2 (ref 1.2–2.2)
Albumin: 3.9 g/dL — ABNORMAL LOW (ref 4.0–5.0)
Alkaline Phosphatase: 80 IU/L (ref 44–121)
BUN/Creatinine Ratio: 4 — ABNORMAL LOW (ref 9–20)
BUN: 4 mg/dL — ABNORMAL LOW (ref 6–24)
Bilirubin Total: 0.8 mg/dL (ref 0.0–1.2)
CO2: 21 mmol/L (ref 20–29)
Calcium: 9 mg/dL (ref 8.7–10.2)
Chloride: 100 mmol/L (ref 96–106)
Creatinine, Ser: 0.94 mg/dL (ref 0.76–1.27)
Globulin, Total: 3.3 g/dL (ref 1.5–4.5)
Glucose: 179 mg/dL — ABNORMAL HIGH (ref 65–99)
Potassium: 3.8 mmol/L (ref 3.5–5.2)
Sodium: 140 mmol/L (ref 134–144)
Total Protein: 7.2 g/dL (ref 6.0–8.5)
eGFR: 99 mL/min/{1.73_m2} (ref >59)

## 2021-07-15 LAB — CBC WITH DIFFERENTIAL/PLATELET
Basophils Absolute: 0.2 10*3/uL (ref 0.0–0.2)
Basos: 2 %
EOS (ABSOLUTE): 0.2 10*3/uL (ref 0.0–0.4)
Eos: 4 %
Hematocrit: 47.2 % (ref 37.5–51.0)
Hemoglobin: 16.1 g/dL (ref 13.0–17.7)
Immature Grans (Abs): 0 10*3/uL (ref 0.0–0.1)
Immature Granulocytes: 0 %
Lymphocytes Absolute: 1.7 10*3/uL (ref 0.7–3.1)
Lymphs: 25 %
MCH: 34.1 pg — ABNORMAL HIGH (ref 26.6–33.0)
MCHC: 34.1 g/dL (ref 31.5–35.7)
MCV: 100 fL — ABNORMAL HIGH (ref 79–97)
Monocytes Absolute: 0.7 10*3/uL (ref 0.1–0.9)
Monocytes: 10 %
Neutrophils Absolute: 3.9 10*3/uL (ref 1.4–7.0)
Neutrophils: 59 %
Platelets: 173 10*3/uL (ref 150–450)
RBC: 4.72 x10E6/uL (ref 4.14–5.80)
RDW: 13.8 % (ref 11.6–15.4)
WBC: 6.6 10*3/uL (ref 3.4–10.8)

## 2021-07-15 LAB — URINALYSIS
Bilirubin, UA: POSITIVE — AB
Glucose, UA: NEGATIVE
Leukocytes,UA: NEGATIVE
Nitrite, UA: NEGATIVE
RBC, UA: NEGATIVE
Specific Gravity, UA: 1.018 (ref 1.005–1.030)
Urobilinogen, Ur: 1 mg/dL (ref 0.2–1.0)
pH, UA: 6 (ref 5.0–7.5)

## 2021-07-15 LAB — LIPID PANEL
Chol/HDL Ratio: 4 ratio (ref 0.0–5.0)
Cholesterol, Total: 190 mg/dL (ref 100–199)
HDL: 48 mg/dL (ref >39)
LDL Chol Calc (NIH): 122 mg/dL — ABNORMAL HIGH (ref 0–99)
Triglycerides: 109 mg/dL (ref 0–149)
VLDL Cholesterol Cal: 20 mg/dL (ref 5–40)

## 2021-07-15 LAB — TESTOSTERONE: Testosterone: 877 ng/dL (ref 264–916)

## 2021-07-15 LAB — HEMOGLOBIN A1C
Est. average glucose Bld gHb Est-mCnc: 103 mg/dL
Hgb A1c MFr Bld: 5.2 % (ref 4.8–5.6)

## 2021-07-15 LAB — TSH: TSH: 4.24 u[IU]/mL (ref 0.450–4.500)

## 2021-07-27 ENCOUNTER — Ambulatory Visit: Payer: Self-pay | Admitting: Licensed Clinical Social Worker

## 2021-07-27 ENCOUNTER — Other Ambulatory Visit: Payer: Self-pay

## 2021-07-27 DIAGNOSIS — F339 Major depressive disorder, recurrent, unspecified: Secondary | ICD-10-CM

## 2021-07-27 DIAGNOSIS — F411 Generalized anxiety disorder: Secondary | ICD-10-CM

## 2021-07-27 NOTE — BH Specialist Note (Signed)
Integrated Behavioral Health Follow Up Telephone Visit  MRN: [id number] Name: Cameron [name]   Total time: 60 minutes  Types of Service: Telephone visit Patient consents to telephone visit and 2 patient identifiers were used to identify patient   Interpretor:No. Interpretor Name and Language: Mental Heath   Subjective: Cameron [name] is a 50 y.o. male accompanied by  himself Patient was referred by [name], NP  for mental health. Patient reports the following symptoms/concerns: The patient noted that he has been doing okay since his last follow-up appointment. He discussed frustration with getting his medication refills and other health concerns. He shared that he just got a new job an hour ago and although it is dealing with the public he is grateful to have a job again. He shared that he gets very anxious working with difficult people at times and is concerned this may be a problem with this job. The patient discussed other financial and relationship problems. He shared that his mother had a small stroke not long ago and that has increased the need for him to take on more responsibilities. He discussed his previous therapist and their connection through dogs. He explained that he is having his dog certified as a support animal. The patient discussed relationship differences and his experiences in relationships. The patient denied any suicidal or homicidal thoughts.  Duration of problem: Years; Severity of problem: moderate  Objective: Mood: Euthymic and Affect: Appropriate Risk of harm to self or others: No plan to harm self or others  Life Context: Family and Social: see above School/Work: see above Self-Care: see above Life Changes: see above  Patient and/or Family's Strengths/Protective Factors: Concrete supports in place (healthy food, safe environments, etc.)  Goals Addressed: Patient will:  Reduce symptoms of: anxiety, depression, insomnia, and stress    Increase knowledge and/or ability of: coping skills, healthy habits, self-management skills, and stress reduction   Demonstrate ability to: Increase healthy adjustment to current life circumstances  Progress towards Goals: Ongoing  Interventions: Interventions utilized:  CBT Cognitive Behavioral Therapywas utilized by the clinician during today's follow up session. The clinician processed with the patient how they have been doing since the last follow-up session. The clinician provided a space for the patient to ventilate their frustrations regarding their current life circumstances.Clinician focused  to identify needs related to stressors and functioning, and assess and monitor for signs and symptoms of anxiety and depression, and assess safety. Clinician measured the patient's anxiety and depression on a numerical scale.The clinician encouraged the patient to utilize their coping skills to deal with their current life circumstances.   Standardized Assessments completed: GAD-7 and PHQ 9 GAD-7    15 PHQ-9    13   Assessment: Patient currently experiencing see above.   Patient may benefit from see above.  Plan: Follow up with behavioral health clinician on : 08/10/2021 at 5:00 PM  Behavioral recommendations:  Referral(s): [facility] [facility] (In Clinic) "From scale of 1-10, how likely are you to follow plan?":   Judith Part, LCSWA

## 2021-07-29 ENCOUNTER — Ambulatory Visit: Payer: Self-pay | Admitting: Gerontology

## 2021-07-29 ENCOUNTER — Other Ambulatory Visit: Payer: Self-pay

## 2021-07-29 ENCOUNTER — Encounter: Payer: Self-pay | Admitting: Gerontology

## 2021-07-29 VITALS — BP 124/88 | HR 96 | Temp 97.8°F | Resp 16 | Ht 67.0 in | Wt 177.9 lb

## 2021-07-29 DIAGNOSIS — R5383 Other fatigue: Secondary | ICD-10-CM

## 2021-07-29 DIAGNOSIS — H6692 Otitis media, unspecified, left ear: Secondary | ICD-10-CM

## 2021-07-29 DIAGNOSIS — R748 Abnormal levels of other serum enzymes: Secondary | ICD-10-CM | POA: Insufficient documentation

## 2021-07-29 DIAGNOSIS — E785 Hyperlipidemia, unspecified: Secondary | ICD-10-CM | POA: Insufficient documentation

## 2021-07-29 DIAGNOSIS — K029 Dental caries, unspecified: Secondary | ICD-10-CM

## 2021-07-29 MED ORDER — CHLORHEXIDINE GLUCONATE 0.12 % MT SOLN
15.0000 mL | Freq: Two times a day (BID) | OROMUCOSAL | 0 refills | Status: DC
  Filled 2021-07-29: qty 473, 16d supply, fill #0

## 2021-07-29 NOTE — Patient Instructions (Signed)
Heart-Healthy Eating Plan Heart-healthy meal planning includes: Eating less unhealthy fats. Eating more healthy fats. Making other changes in your diet. Talk with your doctor or a diet specialist (dietitian) to create an eating plan that is right for you. What is my plan? Your doctor may recommend an eating plan that includes: Total fat: ______% or less of total calories a day. Saturated fat: ______% or less of total calories a day. Cholesterol: less than _________mg a day. What are tips for following this plan? Cooking Avoid frying your food. Try to bake, boil, grill, or broil it instead. You can also reduce fat by: Removing the skin from poultry. Removing all visible fats from meats. Steaming vegetables in water or broth. Meal planning  At meals, divide your plate into four equal parts: Fill one-half of your plate with vegetables and green salads. Fill one-fourth of your plate with whole grains. Fill one-fourth of your plate with lean protein foods. Eat 4-5 servings of vegetables per day. A serving of vegetables is: 1 cup of raw or cooked vegetables. 2 cups of raw leafy greens. Eat 4-5 servings of fruit per day. A serving of fruit is: 1 medium whole fruit.  cup of dried fruit.  cup of fresh, frozen, or canned fruit.  cup of 100% fruit juice. Eat more foods that have soluble fiber. These are apples, broccoli, carrots, beans, peas, and barley. Try to get 20-30 g of fiber per day. Eat 4-5 servings of nuts, legumes, and seeds per week: 1 serving of dried beans or legumes equals  cup after being cooked. 1 serving of nuts is  cup. 1 serving of seeds equals 1 tablespoon. General information Eat more home-cooked food. Eat less restaurant, buffet, and fast food. Limit or avoid alcohol. Limit foods that are high in starch and sugar. Avoid fried foods. Lose weight if you are overweight. Keep track of how much salt (sodium) you eat. This is important if you have high blood  pressure. Ask your doctor to tell you more about this. Try to add vegetarian meals each week. Fats Choose healthy fats. These include olive oil and canola oil, flaxseeds, walnuts, almonds, and seeds. Eat more omega-3 fats. These include salmon, mackerel, sardines, tuna, flaxseed oil, and ground flaxseeds. Try to eat fish at least 2 times each week. Check food labels. Avoid foods with trans fats or high amounts of saturated fat. Limit saturated fats. These are often found in animal products, such as meats, butter, and cream. These are also found in plant foods, such as palm oil, palm kernel oil, and coconut oil. Avoid foods with partially hydrogenated oils in them. These have trans fats. Examples are stick margarine, some tub margarines, cookies, crackers, and other baked goods. What foods can I eat? Fruits All fresh, canned (in natural juice), or frozen fruits. Vegetables Fresh or frozen vegetables (raw, steamed, roasted, or grilled). Green salads. Grains Most grains. Choose whole wheat and whole grains most of the time. Rice and pasta, including brown rice and pastas made with whole wheat. Meats and other proteins Lean, well-trimmed beef, veal, pork, and lamb. Chicken and turkey without skin. All fish and shellfish. Wild duck, rabbit, pheasant, and venison. Egg whites or low-cholesterol egg substitutes. Dried beans, peas, lentils, and tofu. Seeds and most nuts. Dairy Low-fat or nonfat cheeses, including ricotta and mozzarella. Skim or 1% milk that is liquid, powdered, or evaporated. Buttermilk that is made with low-fat milk. Nonfat or low-fat yogurt. Fats and oils Non-hydrogenated (trans-free) margarines. Vegetable oils, including   soybean, sesame, sunflower, olive, peanut, safflower, corn, canola, and cottonseed. Salad dressings or mayonnaise made with a vegetable oil. Beverages Mineral water. Coffee and tea. Diet carbonated beverages. Sweets and desserts Sherbet, gelatin, and fruit ice.  Small amounts of dark chocolate. Limit all sweets and desserts. Seasonings and condiments All seasonings and condiments. The items listed above may not be a complete list of foods and drinks you can eat. Contact a dietitian for more options. What foods should I avoid? Fruits Canned fruit in heavy syrup. Fruit in cream or butter sauce. Fried fruit. Limit coconut. Vegetables Vegetables cooked in cheese, cream, or butter sauce. Fried vegetables. Grains Breads that are made with saturated or trans fats, oils, or whole milk. Croissants. Sweet rolls. Donuts. High-fat crackers, such as cheese crackers. Meats and other proteins Fatty meats, such as hot dogs, ribs, sausage, bacon, rib-eye roast or steak. High-fat deli meats, such as salami and bologna. Caviar. Domestic duck and goose. Organ meats, such as liver. Dairy Cream, sour cream, cream cheese, and creamed cottage cheese. Whole-milk cheeses. Whole or 2% milk that is liquid, evaporated, or condensed. Whole buttermilk. Cream sauce or high-fat cheese sauce. Yogurt that is made from whole milk. Fats and oils Meat fat, or shortening. Cocoa butter, hydrogenated oils, palm oil, coconut oil, palm kernel oil. Solid fats and shortenings, including bacon fat, salt pork, lard, and butter. Nondairy cream substitutes. Salad dressings with cheese or sour cream. Beverages Regular sodas and juice drinks with added sugar. Sweets and desserts Frosting. Pudding. Cookies. Cakes. Pies. Milk chocolate or white chocolate. Buttered syrups. Full-fat ice cream or ice cream drinks. The items listed above may not be a complete list of foods and drinks to avoid. Contact a dietitian for more information. Summary Heart-healthy meal planning includes eating less unhealthy fats, eating more healthy fats, and making other changes in your diet. Eat a balanced diet. This includes fruits and vegetables, low-fat or nonfat dairy, lean protein, nuts and legumes, whole grains, and  heart-healthy oils and fats. This information is not intended to replace advice given to you by your health care provider. Make sure you discuss any questions you have with your health care provider. Document Revised: 03/18/2021 Document Reviewed: 03/18/2021 Elsevier Patient Education  2022 Elsevier Inc.  

## 2021-07-29 NOTE — Progress Notes (Signed)
Established Patient Office Visit  Subjective:  Patient ID: Cameron [name], male    DOB: [birth year]  Age: 50 y.o. MRN: [id number]  CC:  Chief Complaint  Patient presents with   Follow-up   Otalgia    Patient c/o continued irritation in his left ear.    HPI Cameron [name] is a 50 y/o male who has history of Allergy, Asthma, Depression presents for lab review. He continues on the course of Augmentin  for left Otitis media. Currently, he states that his ear feels irritated and itches. He denies otalgia, discharge and mastoid pain. He has left upper mandibula wisdom tooth decay and states that he will follow up with the Dentist .He denies jaw pain, erythema and abscess. His  Lipid panel done on 07/14/21 , LDL was 122 mg/dl, AST was 172 IU/L and ALT was 63 IU/L. He admits to drinking 2-3 or more beers over the weekend. He denies right upper quadrant abdominal pain, jaundice and dark urine. Overall, he states that he's doing well and offers no further complaint.    Past Medical History:  Diagnosis Date   Allergy    Asthma    Depression    Hypertension    Substance abuse ([place])     Past Surgical History:  Procedure Laterality Date   NO PAST SURGERIES      Family History  Problem Relation Age of Onset   Hypertension Mother    Diabetes Father    CAD Father        CABG x 4   Healthy Sister    Heart attack Maternal Grandmother    Cancer Maternal Grandfather    Other Paternal Grandmother        unknown medical history   Heart attack Paternal Grandfather     Social History   Socioeconomic History   Marital status: Single    Spouse name: Not on file   Number of children: Not on file   Years of education: Not on file   Highest education level: Not on file  Occupational History   Occupation: unemployed  Tobacco Use   Smoking status: Never   Smokeless tobacco: Current    Types: Chew  Vaping Use   Vaping Use: Never used  Substance and Sexual Activity   Alcohol  use: Yes    Alcohol/week: 14.0 standard drinks    Types: 14 Cans of beer per week    Comment: 1-2 beers a day and 2-4 beers at day on the weekends   Drug use: Not Currently    Types: Cocaine    Comment: last use 2010   Sexual activity: Not on file  Other Topics Concern   Not on file  Social History Narrative   Social determinants screening completed 02/18/2020. Patient has transportation and is in stable housing. He is working with a temp agency to find work. HS   Social Determinants of Health   Financial Resource Strain: Not on file  Food Insecurity: No Food Insecurity   Worried About Charity fundraiser in the Last Year: Never true   Ran Out of Food in the Last Year: Never true  Transportation Needs: No Transportation Needs   Lack of Transportation (Medical): No   Lack of Transportation (Non-Medical): No  Physical Activity: Not on file  Stress: Not on file  Social Connections: Not on file  Intimate Partner Violence: Not on file    Outpatient Medications Prior to Visit  Medication Sig Dispense Refill  albuterol (VENTOLIN HFA) 108 (90 Base) MCG/ACT inhaler INHALE 2 PUFFS INTO THE LUNGS EVERY 4 HOURS AS NEEDED FOR WHEEZING OR SHORTNESS OF BREATH 6.7 g 3   amLODipine (NORVASC) 5 MG tablet TAKE ONE TABLET BY MOUTH EVERY DAY 30 tablet 3   amoxicillin-clavulanate (AUGMENTIN) 875-125 MG tablet Take 1 tablet by mouth 2 (two) times daily for 10 days. 20 tablet 0   cetirizine (ZYRTEC) 10 MG tablet Take 1 tablet (10 mg total) by mouth daily. 60 tablet 0   sertraline (ZOLOFT) 50 MG tablet Take 2 tablets (100 mg total) by mouth once daily. 60 tablet 0   traZODone (DESYREL) 100 MG tablet Take 1 tablet (100 mg total) by mouth once daily at bedtime. 30 tablet 0   acetaminophen (TYLENOL) 500 MG tablet Take 500 mg by mouth every 6 (six) hours as needed (Joint pain).     ibuprofen (ADVIL) 800 MG tablet Take 800 mg by mouth every 8 (eight) hours as needed (Joint pain).     No  facility-administered medications prior to visit.    No Known Allergies  ROS Review of Systems  Constitutional: Negative.   HENT:  Positive for dental problem and ear pain.   Respiratory: Negative.    Cardiovascular: Negative.   Gastrointestinal: Negative.   Neurological: Negative.      Objective:    Physical Exam HENT:     Head: Normocephalic and atraumatic.     Mouth/Throat:     Mouth: Mucous membranes are moist.   Cardiovascular:     Rate and Rhythm: Normal rate and regular rhythm.     Pulses: Normal pulses.     Heart sounds: Normal heart sounds.  Pulmonary:     Effort: Pulmonary effort is normal.     Breath sounds: Normal breath sounds.  Abdominal:     General: Abdomen is flat. Bowel sounds are normal.     Palpations: Abdomen is soft.  Skin:    General: Skin is warm.  Neurological:     General: No focal deficit present.     Mental Status: He is alert and oriented to person, place, and time. Mental status is at baseline.    BP 124/88 (BP Location: Right Arm, Patient Position: Sitting, Cuff Size: Large)   Pulse 96   Temp 97.8 F (36.6 C)   Resp 16   Ht 5' 7"  (1.702 m)   Wt 177 lb 14.4 oz (80.7 kg)   SpO2 95%   BMI 27.86 kg/m  Wt Readings from Last 3 Encounters:  07/29/21 177 lb 14.4 oz (80.7 kg)  07/14/21 172 lb 6.4 oz (78.2 kg)  05/25/21 165 lb 1.6 oz (74.9 kg)     Health Maintenance Due  Topic Date Due   Pneumococcal Vaccine 3-79 Years old (1 - PCV) Never done   HIV Screening  Never done   Hepatitis C Screening  Never done   COLONOSCOPY (Pts 45-21yr Insurance coverage will need to be confirmed)  Never done   TETANUS/TDAP  06/26/2019   COVID-19 Vaccine (3 - Pfizer risk series) 05/12/2020   INFLUENZA VACCINE  06/21/2021    There are no preventive care reminders to display for this patient.  Lab Results  Component Value Date   TSH 4.240 07/14/2021   Lab Results  Component Value Date   WBC 6.6 07/14/2021   HGB 16.1 07/14/2021   HCT 47.2  07/14/2021   MCV 100 (H) 07/14/2021   PLT 173 07/14/2021   Lab Results  Component Value Date  NA 140 07/14/2021   K 3.8 07/14/2021   CO2 21 07/14/2021   GLUCOSE 179 (H) 07/14/2021   BUN 4 (L) 07/14/2021   CREATININE 0.94 07/14/2021   BILITOT 0.8 07/14/2021   ALKPHOS 80 07/14/2021   AST 173 (H) 07/14/2021   ALT 63 (H) 07/14/2021   PROT 7.2 07/14/2021   ALBUMIN 3.9 (L) 07/14/2021   CALCIUM 9.0 07/14/2021   EGFR 99 07/14/2021   Lab Results  Component Value Date   CHOL 190 07/14/2021   Lab Results  Component Value Date   HDL 48 07/14/2021   Lab Results  Component Value Date   LDLCALC 122 (H) 07/14/2021   Lab Results  Component Value Date   TRIG 109 07/14/2021   Lab Results  Component Value Date   CHOLHDL 4.0 07/14/2021   Lab Results  Component Value Date   HGBA1C 5.2 07/14/2021      Assessment & Plan:   1. Tooth decay - He will continue to use Peridex mouth wash, and follow up with the Dentist. - chlorhexidine (PERIDEX) 0.12 % solution; Use as directed. Rinse and spit 15 mLs in the mouth or throat 2 (two) times daily for 4 days.  Dispense: 473 mL; Refill: 0  2. Left otitis media, unspecified otitis media type - He was advised to finish the course of Augmentin, notify clinic for worsening symptoms.  3. Elevated lipids -The 10-year ASCVD risk score (Arnett DK, et al., 2019) is: 3.4%   Values used to calculate the score:     Age: 51 years     Sex: Male     Is Non-Hispanic African American: No     Diabetic: No     Tobacco smoker: No     Systolic Blood Pressure: 643 mmHg     Is BP treated: Yes     HDL Cholesterol: 48 mg/dL     Total Cholesterol: 190 mg/dL His ASCVD risk was 3.4%, he was encouraged to continue on low fat/ cholesterol diet.  4. Elevated liver enzymes - His liver enzyme was elevated, he was encouraged to abstain from alcohol consumption. - Hepatic function panel; Future - Hepatic function panel  5. Fatigue, unspecified type - Will  check Vitamin D to rule out fatigue. - Vitamin D (25 hydroxy); Future - B12 and Folate Panel; Future - B12 and Folate Panel - Vitamin D (25 hydroxy)      Follow-up: Return in about 3 months (around 11/02/2021), or if symptoms worsen or fail to improve.    Heriberto Stmartin Jerold Coombe, NP

## 2021-07-30 LAB — HEPATIC FUNCTION PANEL
ALT: 50 IU/L — ABNORMAL HIGH (ref 0–44)
AST: 104 IU/L — ABNORMAL HIGH (ref 0–40)
Albumin: 4.2 g/dL (ref 4.0–5.0)
Alkaline Phosphatase: 77 IU/L (ref 44–121)
Bilirubin Total: 0.5 mg/dL (ref 0.0–1.2)
Bilirubin, Direct: 0.18 mg/dL (ref 0.00–0.40)
Total Protein: 7.6 g/dL (ref 6.0–8.5)

## 2021-07-30 LAB — B12 AND FOLATE PANEL
Folate: 2.6 ng/mL — ABNORMAL LOW (ref >3.0)
Vitamin B-12: 534 pg/mL (ref 232–1245)

## 2021-07-30 LAB — VITAMIN D 25 HYDROXY (VIT D DEFICIENCY, FRACTURES): Vit D, 25-Hydroxy: 24.3 ng/mL — ABNORMAL LOW (ref 30.0–100.0)

## 2021-08-04 ENCOUNTER — Other Ambulatory Visit: Payer: Self-pay | Admitting: Gerontology

## 2021-08-04 ENCOUNTER — Other Ambulatory Visit: Payer: Self-pay

## 2021-08-04 DIAGNOSIS — E559 Vitamin D deficiency, unspecified: Secondary | ICD-10-CM

## 2021-08-04 DIAGNOSIS — F339 Major depressive disorder, recurrent, unspecified: Secondary | ICD-10-CM

## 2021-08-04 DIAGNOSIS — E538 Deficiency of other specified B group vitamins: Secondary | ICD-10-CM

## 2021-08-04 DIAGNOSIS — Z8659 Personal history of other mental and behavioral disorders: Secondary | ICD-10-CM

## 2021-08-04 MED ORDER — FOLIC ACID 1 MG PO TABS
1.0000 mg | ORAL_TABLET | Freq: Every day | ORAL | 2 refills | Status: DC
  Filled 2021-08-04: qty 30, 30d supply, fill #0
  Filled 2021-09-20: qty 30, 30d supply, fill #1
  Filled 2021-11-01: qty 30, 30d supply, fill #2

## 2021-08-04 MED ORDER — VITAMIN B-12 100 MCG PO TABS
100.0000 ug | ORAL_TABLET | Freq: Every day | ORAL | 2 refills | Status: DC
  Filled 2021-08-04: qty 30, 30d supply, fill #0

## 2021-08-04 MED ORDER — VITAMIN D (ERGOCALCIFEROL) 1.25 MG (50000 UNIT) PO CAPS
50000.0000 [IU] | ORAL_CAPSULE | ORAL | 0 refills | Status: DC
  Filled 2021-08-04: qty 12, 84d supply, fill #0

## 2021-08-04 MED FILL — Sertraline HCl Tab 50 MG: ORAL | 30 days supply | Qty: 60 | Fill #0 | Status: AC

## 2021-08-06 ENCOUNTER — Other Ambulatory Visit: Payer: Self-pay

## 2021-08-10 ENCOUNTER — Ambulatory Visit: Payer: Self-pay | Admitting: Licensed Clinical Social Worker

## 2021-08-10 ENCOUNTER — Telehealth: Payer: Self-pay | Admitting: Licensed Clinical Social Worker

## 2021-08-10 NOTE — Telephone Encounter (Signed)
Called the patient twice during today's scheduled appointment; no answer, left a voicemail with the clinic contact information so they may reschedule.   

## 2021-08-12 ENCOUNTER — Ambulatory Visit: Payer: Self-pay | Admitting: Gerontology

## 2021-08-25 ENCOUNTER — Other Ambulatory Visit: Payer: Self-pay

## 2021-09-01 ENCOUNTER — Ambulatory Visit: Payer: Self-pay | Admitting: Gerontology

## 2021-09-16 ENCOUNTER — Telehealth: Payer: Self-pay | Admitting: Licensed Clinical Social Worker

## 2021-09-16 NOTE — Telephone Encounter (Signed)
Made second attempt to get patient back on HPs schedule and left a voicemail to call the clinic back.

## 2021-09-16 NOTE — Telephone Encounter (Signed)
Left voicemail to get patient back on HP's schedule.

## 2021-09-20 ENCOUNTER — Other Ambulatory Visit: Payer: Self-pay | Admitting: Gerontology

## 2021-09-20 ENCOUNTER — Other Ambulatory Visit: Payer: Self-pay

## 2021-09-20 DIAGNOSIS — F339 Major depressive disorder, recurrent, unspecified: Secondary | ICD-10-CM

## 2021-09-20 DIAGNOSIS — E559 Vitamin D deficiency, unspecified: Secondary | ICD-10-CM

## 2021-09-20 DIAGNOSIS — Z8659 Personal history of other mental and behavioral disorders: Secondary | ICD-10-CM

## 2021-09-21 ENCOUNTER — Telehealth: Payer: Self-pay | Admitting: Licensed Clinical Social Worker

## 2021-09-21 ENCOUNTER — Other Ambulatory Visit: Payer: Self-pay | Admitting: Gerontology

## 2021-09-21 ENCOUNTER — Other Ambulatory Visit: Payer: Self-pay

## 2021-09-21 DIAGNOSIS — Z8659 Personal history of other mental and behavioral disorders: Secondary | ICD-10-CM

## 2021-09-21 DIAGNOSIS — F339 Major depressive disorder, recurrent, unspecified: Secondary | ICD-10-CM

## 2021-09-21 MED ORDER — TRAZODONE HCL 100 MG PO TABS
100.0000 mg | ORAL_TABLET | Freq: Every day | ORAL | 0 refills | Status: DC
  Filled 2021-09-21: qty 30, 30d supply, fill #0

## 2021-09-21 MED FILL — Sertraline HCl Tab 50 MG: ORAL | 30 days supply | Qty: 60 | Fill #0 | Status: AC

## 2021-09-21 NOTE — Telephone Encounter (Signed)
Made third attempt to get patient back on HPs schedule. Left a voicemail to call the clinic.

## 2021-09-28 ENCOUNTER — Ambulatory Visit: Payer: Self-pay

## 2021-09-28 ENCOUNTER — Other Ambulatory Visit: Payer: Self-pay

## 2021-09-28 DIAGNOSIS — Z79899 Other long term (current) drug therapy: Secondary | ICD-10-CM

## 2021-09-28 NOTE — Progress Notes (Signed)
Medication Management Clinic Visit Note  Patient: Cameron [name] MRN: [id number] Date of Birth: 10-24-1971 PCP: Patient, No Pcp Per (Inactive)   [name] 50 y.o. male presents for a yearly MTM visit today. Patient identity verified with two patient identifiers (name and DOB)  There were no vitals taken for this visit.  Patient Information   Past Medical History:  Diagnosis Date   Allergy    Asthma    Depression    Hypertension    Substance abuse (HCC)       Past Surgical History:  Procedure Laterality Date   NO PAST SURGERIES       Family History  Problem Relation Age of Onset   Hypertension Mother    Diabetes Father    CAD Father        CABG x 4   Healthy Sister    Heart attack Maternal Grandmother    Cancer Maternal Grandfather    Other Paternal Grandmother        unknown medical history   Heart attack Paternal Grandfather     New Diagnoses (since last visit):   Family Support: Good  Lifestyle Diet: Breakfast: eggs, meal replacement shakes  Lunch: usually skips  Dinner:spaghetti, fish, vegetables, steak, steak, chicken  Drinks:coffee, Gatorade, water             Social History   Substance and Sexual Activity  Alcohol Use Yes   Alcohol/week: 14.0 standard drinks   Types: 14 Cans of beer per week   Comment: 1-2 beers a day and 2-4 beers at day on the weekends      Social History   Tobacco Use  Smoking Status Never  Smokeless Tobacco Current   Types: Chew      Health Maintenance  Topic Date Due   Pneumococcal Vaccine 36-74 Years old (1 - PCV) Never done   HIV Screening  Never done   Hepatitis C Screening  Never done   COLONOSCOPY (Pts 45-55yrs Insurance coverage will need to be confirmed)  Never done   TETANUS/TDAP  06/26/2019   COVID-19 Vaccine (3 - Pfizer risk series) 05/12/2020   INFLUENZA VACCINE  06/21/2021   HPV VACCINES  Aged Out   Health Maintenance/Date Completed  Last ED visit: 12/04/2019 Last Visit  to PCP: 09/21/2021 Next Visit to PCP: to follow-up  Specialist Visit: not recent specialist visits  Dental Exam: to schedule appointment (years since last visit)  Eye Exam: to schedule appointment (years since last visit)  Prostate Exam: never had Pelvic/PAP Exam: N/A Mammogram: N/A DEXA: N/A Colonoscopy: never had  Flu Vaccine: not received  Pneumonia Vaccine: not received  COVID-19 Vaccine: not received  Shingrix Vaccine: not received    Outpatient Encounter Medications as of 09/28/2021  Medication Sig   albuterol (VENTOLIN HFA) 108 (90 Base) MCG/ACT inhaler INHALE 2 PUFFS INTO THE LUNGS EVERY 4 HOURS AS NEEDED FOR WHEEZING OR SHORTNESS OF BREATH   amLODipine (NORVASC) 5 MG tablet TAKE ONE TABLET BY MOUTH EVERY DAY   cetirizine (ZYRTEC) 10 MG tablet Take 1 tablet (10 mg total) by mouth daily.   chlorhexidine (PERIDEX) 0.12 % solution Use as directed. Rinse and spit 15 mLs in the mouth or throat 2 (two) times daily for 4 days.   folic acid (FOLVITE) 1 MG tablet Take 1 tablet (1 mg total) by mouth once daily.   sertraline (ZOLOFT) 50 MG tablet Take 2 tablets (100 mg total) by mouth once daily.   traZODone (DESYREL) 100  MG tablet Take 1 tablet (100 mg total) by mouth once daily at bedtime.   vitamin B-12 (CYANOCOBALAMIN) 100 MCG tablet Take 1 tablet (100 mcg total) by mouth once daily.   [DISCONTINUED] amoxicillin-clavulanate (AUGMENTIN) 875-125 MG tablet Take 1 tablet by mouth 2 (two) times daily for 10 days. (Patient not taking: Reported on 09/28/2021)   No facility-administered encounter medications on file as of 09/28/2021.     Assessment and Plan: Medication adherence: patient reports good adherence to medications, does not report any missed doses.  Allergies --Currently receiving Zyrtec, and reports no issues   Asthma --Currently receiving PRN Ventolin, and reports no issues --Reports daily use, and stated he used to have a maintenance inhaler but unsure name or if he needs  refills --Patient stated he will check at his home and call [facility] to send refill request  Depression --Currently receiving sertraline and reports he feels given the ~20 years of use, that his body has become tolerant to the medication --Instructed patient to discuss with physiatrist, and patient is agreeable    HTN --Recently started on amlodipine and reports no issues --Patient does have blood pressure cuff at home and measures blood pressure periodically throughout the week --Reports numbers are normal (SBP 130-140's)  Tobacco/alcohol use --Endorses using chewing tobacco and drinking alcohol  --Encouraged patient for cessation of substances listed above, and stated [facility] had resources available if patient would like  Patient encouraged to maintain the active role in his medical care he currently has. Counseled patient on importance of follow-up with primary care, dental, and vision appointments. Patient was agreeable to all, and stated he would call and schedule appointments with the needed providers. Plan to return to clinic in 1 year for annual MTM.   Doloris Hall, PharmD Pharmacy Resident  09/28/2021 12:26 PM

## 2021-10-07 ENCOUNTER — Ambulatory Visit: Payer: Self-pay | Admitting: Licensed Clinical Social Worker

## 2021-10-07 ENCOUNTER — Other Ambulatory Visit: Payer: Self-pay

## 2021-10-07 DIAGNOSIS — F411 Generalized anxiety disorder: Secondary | ICD-10-CM

## 2021-10-07 DIAGNOSIS — F331 Major depressive disorder, recurrent, moderate: Secondary | ICD-10-CM

## 2021-10-07 NOTE — BH Specialist Note (Signed)
Integrated Behavioral Health Follow Up In-Person Visit  MRN: [id number] Name: Cameron [name]   Total time: 60 minutes  Types of Service: Telephone visit Patient consents to telephone visit and 2 patient identifiers were used to identify patient   Interpretor:No. Interpretor Name and Language: N/A  Subjective: Cameron [name] is a 50 y.o. male accompanied by  himself Patient was referred by Carlyon Shadow, NP for mental health. Patient reports the following symptoms/concerns: The patient reports that he has been doing worse since his last follow-up appointment. He shared that his mother past away unexpectedly September 24 th. [place] discussed his feelings of grief and the events leading up to and after his mother's death. He discussed family stressors and the additional stressors of executing his mother's will. He notes that since his mother's passing he lost his job. The patient noted that he cries frequently throughout his day. He shared that he has been trying to walk his dog and spend time with her. He explained that he rented a room to a friend for a few weeks until her house is ready. He explained he is not sure what he will do when she leaves because he doesn't have any friends. [place] discussed financial stressors that are creating family discord. He reports he is taking Trazodone 100 MG and sertraline 100 mg daily. and [place] noted he was dreading the holidays and thinking about next  week is already painful. The patient denied any suicidal or homicidal thoughts.  Duration of problem: Years; Severity of problem: moderate  Objective: Mood: Depressed and Affect: Appropriate Risk of harm to self or others: No plan to harm self or others  Life Context: Family and Social: see above School/Work: see above Self-Care: see above Life Changes: see above  Patient and/or Family's Strengths/Protective Factors: Concrete supports in place (healthy food, safe environments, etc.)  Goals  Addressed: Patient will:  Reduce symptoms of: agitation, anxiety, depression, insomnia, and stress   Increase knowledge and/or ability of: coping skills, healthy habits, self-management skills, and stress reduction   Demonstrate ability to: Increase healthy adjustment to current life circumstances and Increase adequate support systems for patient/family  Progress towards Goals: Ongoing  Interventions: Interventions utilized:  Supportive Counselingwas utilized by the clinician during today's follow up session. Clinician met with patient to identify needs related to stressors and functioning, and assess and monitor for signs and symptoms of anxiety and depression, and assess safety. The clinician processed with the patient how they have been doing since the last follow-up session. Clinician provided a safe judgement free space for the patient to ventilate his feelings of grief and his frustrations regarding current family stressors. Clinician offered her condolences to the patient on the loss of his mother and collaborated with the patient to the normalized his grieving process. Clinician assessed the patient's use of coping strategies; reinforced his positive strategies such as going for a walk with his dog that was effective and made suggestions for improvements such as encouraging him to practice daily self care. Clinician scheduled an earlier follow-up for the patient. Standardized Assessments completed: GAD-7 and PHQ 9 PHQ-9 =  18 GAD-7 =  20  Assessment: Patient currently experiencing see above.   Patient may benefit from see above.  Plan: Follow up with behavioral health clinician on : 10/20/2021 Behavioral recommendations:  Referral(s): [place] (In Clinic) "From scale of 1-10, how likely are you to follow plan?":   Lesli Albee, LCSWA

## 2021-10-19 ENCOUNTER — Other Ambulatory Visit: Payer: Self-pay | Admitting: Gerontology

## 2021-10-19 ENCOUNTER — Other Ambulatory Visit: Payer: Self-pay

## 2021-10-19 DIAGNOSIS — F339 Major depressive disorder, recurrent, unspecified: Secondary | ICD-10-CM

## 2021-10-19 DIAGNOSIS — Z8659 Personal history of other mental and behavioral disorders: Secondary | ICD-10-CM

## 2021-10-19 MED ORDER — SERTRALINE HCL 50 MG PO TABS
150.0000 mg | ORAL_TABLET | Freq: Every day | ORAL | 0 refills | Status: DC
Start: 2021-10-19 — End: 2021-12-07
  Filled 2021-10-19: qty 90, 30d supply, fill #0

## 2021-10-20 ENCOUNTER — Ambulatory Visit: Payer: Self-pay | Admitting: Licensed Clinical Social Worker

## 2021-10-20 ENCOUNTER — Other Ambulatory Visit: Payer: Self-pay

## 2021-10-20 DIAGNOSIS — F411 Generalized anxiety disorder: Secondary | ICD-10-CM

## 2021-10-20 DIAGNOSIS — F331 Major depressive disorder, recurrent, moderate: Secondary | ICD-10-CM

## 2021-10-20 NOTE — BH Specialist Note (Signed)
Integrated Behavioral Health Follow Up In-Person Visit  MRN: [id number] Name: Cameron [name]  Total time: 60 minutes  Types of Service: Individual psychotherapy  Interpretor:No. Interpretor Name and Language: N/A  Subjective: CRAYTON SAVARESE [name] is a 50 y.o. male accompanied by  himself Patient was referred by Carlyon Shadow, NP for Mental Health. Patient reports the following symptoms/concerns: The patient reports that he has been worse since his last follow-up appointment. He noted that since his mother passed away last 2023/08/27 he feels like he is spiraling out of control. He noted that he is experiencing increased irritability and sleep disturbances. He shared that he continues to have decreased appetite and loss of interest in previously enjoyed activities. The patient discussed family stressors related to closing his mother's estate. The patient shared that he is facing increased financial burdens as the executor of the estate and has taken on a friend as a roommate to help. He noted that he feels less lonely having someone else in the home. He discussed increased anxiety over Thanksgiving in response to family pressures. He shared he is not looking forward to Christmas and plans to stay at home and spend time with his dog and roommate. The patient denied any suicidal or homicidal thoughts.  Duration of problem: Years; Severity of problem: moderate  Objective: Mood: Euthymic and Affect: Appropriate Risk of harm to self or others: No plan to harm self or others  Life Context: Family and Social: see above School/Work: see above Self-Care: see above Life Changes: see above  Patient and/or Family's Strengths/Protective Factors: Concrete supports in place (healthy food, safe environments, etc.) and Sense of purpose  Goals Addressed: Patient will:  Reduce symptoms of: agitation, anxiety, depression, insomnia, mood instability, and stress   Increase knowledge and/or ability  of: coping skills, healthy habits, self-management skills, and stress reduction   Demonstrate ability to: Increase healthy adjustment to current life circumstances and Increase adequate support systems for patient/family  Progress towards Goals: Ongoing  Interventions: Interventions utilized:  Supportive Counseling was utilized by the clinician during today's follow up session. Clinician met with patient to identify needs related to stressors and functioning, and assess and monitor for signs and symptoms of anxiety and depression, and assess safety. The clinician processed with the patient how they have been doing since the last follow-up session.  Clinician intervened with positive regard and optimism to validate client's emotions, and supported client in ways to access community supports. Clinician provided the patient with referrals to legal aid, [employer], and [employer].The clinician encouraged the patient to utilize their coping skills to deal with their current life circumstances.  Session ended with scheduling.    Assessment: Patient currently experiencing see above.   Patient may benefit from see above.  Plan: Follow up with behavioral health clinician on : 11/04/2021 Behavioral recommendations:  Referral(s): [place] (In Clinic) "From scale of 1-10, how likely are you to follow plan?":   Lesli Albee, LCSWA

## 2021-10-27 ENCOUNTER — Other Ambulatory Visit: Payer: Self-pay

## 2021-10-28 ENCOUNTER — Other Ambulatory Visit: Payer: Self-pay | Admitting: Gerontology

## 2021-10-28 ENCOUNTER — Other Ambulatory Visit: Payer: Self-pay

## 2021-10-28 DIAGNOSIS — E538 Deficiency of other specified B group vitamins: Secondary | ICD-10-CM

## 2021-10-28 DIAGNOSIS — R748 Abnormal levels of other serum enzymes: Secondary | ICD-10-CM

## 2021-10-28 DIAGNOSIS — E559 Vitamin D deficiency, unspecified: Secondary | ICD-10-CM

## 2021-11-01 ENCOUNTER — Other Ambulatory Visit: Payer: Self-pay | Admitting: Gerontology

## 2021-11-01 ENCOUNTER — Other Ambulatory Visit: Payer: Self-pay

## 2021-11-01 DIAGNOSIS — F339 Major depressive disorder, recurrent, unspecified: Secondary | ICD-10-CM

## 2021-11-01 DIAGNOSIS — J45909 Unspecified asthma, uncomplicated: Secondary | ICD-10-CM

## 2021-11-01 DIAGNOSIS — Z8659 Personal history of other mental and behavioral disorders: Secondary | ICD-10-CM

## 2021-11-01 DIAGNOSIS — I10 Essential (primary) hypertension: Secondary | ICD-10-CM

## 2021-11-02 ENCOUNTER — Other Ambulatory Visit: Payer: Self-pay

## 2021-11-02 ENCOUNTER — Ambulatory Visit: Payer: Self-pay | Admitting: Gerontology

## 2021-11-02 MED ORDER — AMLODIPINE BESYLATE 5 MG PO TABS
ORAL_TABLET | Freq: Every day | ORAL | 3 refills | Status: DC
  Filled 2021-11-02: qty 30, 30d supply, fill #0
  Filled 2021-12-03: qty 30, 30d supply, fill #1
  Filled 2022-01-19: qty 30, 30d supply, fill #2
  Filled 2022-02-14: qty 30, 30d supply, fill #3

## 2021-11-02 MED ORDER — ALBUTEROL SULFATE HFA 108 (90 BASE) MCG/ACT IN AERS
2.0000 | INHALATION_SPRAY | RESPIRATORY_TRACT | 3 refills | Status: DC | PRN
  Filled 2021-11-02: qty 6.7, 17d supply, fill #0

## 2021-11-02 MED ORDER — TRAZODONE HCL 100 MG PO TABS
100.0000 mg | ORAL_TABLET | Freq: Every day | ORAL | 0 refills | Status: DC
  Filled 2021-11-02: qty 30, 30d supply, fill #0

## 2021-11-03 ENCOUNTER — Encounter: Payer: Self-pay | Admitting: Gerontology

## 2021-11-03 ENCOUNTER — Other Ambulatory Visit: Payer: Self-pay

## 2021-11-03 ENCOUNTER — Ambulatory Visit: Payer: Self-pay | Admitting: Gerontology

## 2021-11-03 VITALS — BP 91/66 | HR 97 | Temp 97.7°F | Resp 16 | Ht 67.0 in | Wt 169.8 lb

## 2021-11-03 DIAGNOSIS — F1010 Alcohol abuse, uncomplicated: Secondary | ICD-10-CM

## 2021-11-03 DIAGNOSIS — Z8659 Personal history of other mental and behavioral disorders: Secondary | ICD-10-CM

## 2021-11-03 NOTE — Progress Notes (Signed)
Established Patient Office Visit  Subjective:  Patient ID: Cameron [name], male    DOB: [birth year]  Age: 50 y.o. MRN: [id number]  CC:  Chief Complaint  Patient presents with   Follow-up   Medication Refill    HPI Cameron [name] is a 50 y/o male who has history of Allergy, Asthma, Depression presents for routine follow up visit. He states that he's under a lot of stress after his Mother died few weeks ago.  He states that he is compliant with his medication, denies side effects and continues to make healthy lifestyle changes.  He defers lab work until next year, states that he has been drinking a lot of alcoholic beverages, but continues to cut back and denies withdrawal symptoms.  He states that he is anxious, but denies suicidal no homicidal ideation and will follow-up with [facility] behavioral health team tomorrow.  Overall, he states that he is doing well and offers no further complaint.  Past Medical History:  Diagnosis Date   Allergy    Asthma    Depression    Hypertension    Substance abuse ([place])     Past Surgical History:  Procedure Laterality Date   NO PAST SURGERIES      Family History  Problem Relation Age of Onset   Hyperlipidemia Mother    Stroke Mother    Hypertension Mother    Aneurysm Mother    Diabetes Father    CAD Father        CABG x 4   Healthy Sister    Heart attack Maternal Grandmother    Cancer Maternal Grandfather    Other Paternal Grandmother        unknown medical history   Heart attack Paternal Grandfather     Social History   Socioeconomic History   Marital status: Single    Spouse name: Not on file   Number of children: Not on file   Years of education: Not on file   Highest education level: Not on file  Occupational History   Occupation: unemployed  Tobacco Use   Smoking status: Never   Smokeless tobacco: Current    Types: Chew  Vaping Use   Vaping Use: Never used  Substance and Sexual Activity   Alcohol use: Yes     Alcohol/week: 14.0 standard drinks    Types: 14 Cans of beer per week    Comment: 1-2 beers a day and 2-4 beers at day on the weekends   Drug use: Not Currently    Types: Cocaine    Comment: last use 2010   Sexual activity: Not on file  Other Topics Concern   Not on file  Social History Narrative   Social determinants screening completed 02/18/2020. Patient has transportation and is in stable housing. He is working with a temp agency to find work. HS   Social Determinants of Health   Financial Resource Strain: Not on file  Food Insecurity: No Food Insecurity   Worried About Charity fundraiser in the Last Year: Never true   Ran Out of Food in the Last Year: Never true  Transportation Needs: No Transportation Needs   Lack of Transportation (Medical): No   Lack of Transportation (Non-Medical): No  Physical Activity: Not on file  Stress: Not on file  Social Connections: Not on file  Intimate Partner Violence: Not on file    Outpatient Medications Prior to Visit  Medication Sig Dispense Refill   albuterol (VENTOLIN HFA)  108 (90 Base) MCG/ACT inhaler INHALE 2 PUFFS INTO THE LUNGS EVERY 4 HOURS AS NEEDED FOR WHEEZING OR SHORTNESS OF BREATH 6.7 g 3   amLODipine (NORVASC) 5 MG tablet TAKE ONE TABLET BY MOUTH EVERY DAY 30 tablet 3   cetirizine (ZYRTEC) 10 MG tablet Take 1 tablet (10 mg total) by mouth daily. 60 tablet 0   folic acid (FOLVITE) 1 MG tablet Take 1 tablet (1 mg total) by mouth once daily. 30 tablet 2   sertraline (ZOLOFT) 50 MG tablet Take 3 tablets (150 mg total) by mouth once daily. 90 tablet 0   traZODone (DESYREL) 100 MG tablet Take 1 tablet (100 mg total) by mouth once daily at bedtime. 30 tablet 0   vitamin B-12 (CYANOCOBALAMIN) 100 MCG tablet Take 1 tablet (100 mcg total) by mouth once daily. (Patient not taking: Reported on 11/03/2021) 30 tablet 2   chlorhexidine (PERIDEX) 0.12 % solution Use as directed. Rinse and spit 15 mLs in the mouth or throat 2 (two) times daily  for 4 days. 473 mL 0   No facility-administered medications prior to visit.    No Known Allergies  ROS Review of Systems  Constitutional: Negative.   Eyes: Negative.   Respiratory: Negative.    Cardiovascular: Negative.   Genitourinary: Negative.   Skin: Negative.   Neurological: Negative.   Psychiatric/Behavioral:  The patient is nervous/anxious.      Objective:    Physical Exam HENT:     Head: Normocephalic and atraumatic.     Mouth/Throat:     Mouth: Mucous membranes are moist.  Eyes:     Extraocular Movements: Extraocular movements intact.     Conjunctiva/sclera: Conjunctivae normal.     Pupils: Pupils are equal, round, and reactive to light.  Cardiovascular:     Rate and Rhythm: Normal rate and regular rhythm.     Pulses: Normal pulses.     Heart sounds: Normal heart sounds.  Pulmonary:     Effort: Pulmonary effort is normal.     Breath sounds: Normal breath sounds.  Skin:    General: Skin is warm.  Neurological:     General: No focal deficit present.     Mental Status: He is alert and oriented to person, place, and time. Mental status is at baseline.  Psychiatric:        Mood and Affect: Mood normal.        Behavior: Behavior normal.        Thought Content: Thought content normal.        Judgment: Judgment normal.    BP 91/66 (BP Location: Right Arm, Patient Position: Sitting, Cuff Size: Large)    Pulse 97    Temp 97.7 F (36.5 C) (Oral)    Resp 16    Ht 5' 7"  (1.702 m)    Wt 169 lb 12.8 oz (77 kg)    SpO2 98%    BMI 26.59 kg/m  Wt Readings from Last 3 Encounters:  11/03/21 169 lb 12.8 oz (77 kg)  07/29/21 177 lb 14.4 oz (80.7 kg)  07/14/21 172 lb 6.4 oz (78.2 kg)     Health Maintenance Due  Topic Date Due   Pneumococcal Vaccine 66-48 Years old (1 - PCV) Never done   HIV Screening  Never done   Hepatitis C Screening  Never done   COLONOSCOPY (Pts 45-47yr Insurance coverage will need to be confirmed)  Never done   TETANUS/TDAP  06/26/2019    COVID-19 Vaccine (3 - Pfizer risk  series) 05/12/2020   INFLUENZA VACCINE  06/21/2021    There are no preventive care reminders to display for this patient.  Lab Results  Component Value Date   TSH 4.240 07/14/2021   Lab Results  Component Value Date   WBC 6.6 07/14/2021   HGB 16.1 07/14/2021   HCT 47.2 07/14/2021   MCV 100 (H) 07/14/2021   PLT 173 07/14/2021   Lab Results  Component Value Date   NA 140 07/14/2021   K 3.8 07/14/2021   CO2 21 07/14/2021   GLUCOSE 179 (H) 07/14/2021   BUN 4 (L) 07/14/2021   CREATININE 0.94 07/14/2021   BILITOT 0.5 07/29/2021   ALKPHOS 77 07/29/2021   AST 104 (H) 07/29/2021   ALT 50 (H) 07/29/2021   PROT 7.6 07/29/2021   ALBUMIN 4.2 07/29/2021   CALCIUM 9.0 07/14/2021   EGFR 99 07/14/2021   Lab Results  Component Value Date   CHOL 190 07/14/2021   Lab Results  Component Value Date   HDL 48 07/14/2021   Lab Results  Component Value Date   LDLCALC 122 (H) 07/14/2021   Lab Results  Component Value Date   TRIG 109 07/14/2021   Lab Results  Component Value Date   CHOLHDL 4.0 07/14/2021   Lab Results  Component Value Date   HGBA1C 5.2 07/14/2021      Assessment & Plan:      1. History of anxiety -He will continue on current medication and follow-up with [facility] behavioral health.  Was provided with crisis line information and advised to call for worsening symptoms.   2. Alcohol abuse -He was strongly advised on alcohol abstinence, and to call the crisis helpline with worsening symptoms.   Follow-up: Return in about 6 weeks (around 12/14/2021), or if symptoms worsen or fail to improve.    [employer] Jerold Coombe, NP

## 2021-11-04 ENCOUNTER — Ambulatory Visit: Payer: Self-pay | Admitting: Licensed Clinical Social Worker

## 2021-11-04 DIAGNOSIS — F339 Major depressive disorder, recurrent, unspecified: Secondary | ICD-10-CM

## 2021-11-04 DIAGNOSIS — F411 Generalized anxiety disorder: Secondary | ICD-10-CM

## 2021-11-04 DIAGNOSIS — F1010 Alcohol abuse, uncomplicated: Secondary | ICD-10-CM

## 2021-11-04 NOTE — BH Specialist Note (Signed)
Integrated Behavioral Health Follow Up Telephone Visit  MRN: [id number] Name: Cameron [name]   Total time: 60 minutes  Types of Service: Telephone visit Patient consents to telephone visit and 2 patient identifiers were used to identify patient   Interpretor:No. Interpretor Name and Language: N/A  Subjective: Cameron [name] is a 50 y.o. male accompanied by  herself Patient was referred by Carlyon Shadow, NP for Mental Health. Patient reports the following symptoms/concerns: The patient reports he has been doing the same since his last follow-up appointment. He stated that he has not had any further contact with his sister since Thanksgiving. He noted that the conflict between them revolves around his mother's estate and his friend staying with him in their late mother's home that he inherited. The patient stated he is not sure if he is still grieving the loss of his mother. He stated he continues to cry throughout his day, have nightly sleep disturbances and increased anxiety symptoms daily. He noted that it has been difficult to get his emotional support dog, Jezebel, to out for a walk in the rain and they are both staying in more. The patient shared that he was okay with having friend in common with the clinician and understood that he could not be "friends" on social media platforms. He discussed financial and interpersonal stressors impacting his life. The patient denied any suicidal or homicidal thoughts.  Duration of problem: Years; Severity of problem: moderate  Objective: Mood: Euthymic and Affect: Appropriate Risk of harm to self or others: No plan to harm self or others  Life Context: Family and Social: see above School/Work: see above Self-Care: see above Life Changes: see above  Patient and/or Family's Strengths/Protective Factors: Concrete supports in place (healthy food, safe environments, etc.)  Goals Addressed: Patient will:  Reduce symptoms of:  agitation, anxiety, depression, and insomnia   Increase knowledge and/or ability of: coping skills, healthy habits, self-management skills, and stress reduction   Demonstrate ability to: Increase adequate support systems for patient/family and Increase motivation to adhere to plan of care  Progress towards Goals: Ongoing  Interventions: Interventions utilized:  CBT Cognitive Behavioral Therapy was utilized by the clinician during today's follow up session. Clinician met with patient to identify needs related to stressors and functioning, and assess and monitor for signs and symptoms of anxiety and depression, and assess safety. The clinician processed with the patient how they have been doing since the last follow-up session. Clinician encouraged the patient to continue to work on calming techniques (e.g.. paced breathing, deep muscle relaxation, and calming imagery) as a strategy for responding appropriately to anxiety and the urge to avoid situations or self-isolate when they occur and move towards increasing the patients self regulation. Clinician explained that she appreciated the friend request the patient sent on social media, but could not accept the request. Additionally, clinician informed the patient that she saw they had 33 friends in common from the friend request and processed with the patient boundaries that would be in place if he was comfortable continuing therapy. Session ended with scheduling.  Standardized Assessments completed on 10/07/2021: GAD-7 and PHQ 9  PHQ-9 =  18 GAD-7 =  20  Assessment: Patient currently experiencing see above.   Patient may benefit from see above.  Plan: Follow up with behavioral health clinician on : 11/30/2020 at 3:00 PM  Behavioral recommendations:  Referral(s): [place] (In Clinic) "From scale of 1-10, how likely are you to follow plan?":  Lesli Albee, LCSWA

## 2021-11-30 ENCOUNTER — Ambulatory Visit: Payer: Self-pay | Admitting: Licensed Clinical Social Worker

## 2021-11-30 ENCOUNTER — Other Ambulatory Visit: Payer: Self-pay

## 2021-11-30 DIAGNOSIS — F339 Major depressive disorder, recurrent, unspecified: Secondary | ICD-10-CM

## 2021-11-30 DIAGNOSIS — F1010 Alcohol abuse, uncomplicated: Secondary | ICD-10-CM

## 2021-11-30 DIAGNOSIS — F411 Generalized anxiety disorder: Secondary | ICD-10-CM

## 2021-11-30 NOTE — BH Specialist Note (Signed)
Integrated Behavioral Health via Telemedicine Visit  11/30/2021 Cameron [name] [id number]  Total time: 60  Referring Provider: Carlyon Shadow  Patient/Family location: [facility] [address] [place], [place] [address]  [facility] Provider location: [facility] All persons participating in visit: Ezariah Nace. Morene Rankins [name] and Jerrilyn Cairo, MSW, LCSW-A Types of Service: Telephone visit  I connected with [name] via  Telephone or Video Enabled Telemedicine Application  (Video is Caregility application) and verified that I am speaking with the correct person using two identifiers. Discussed confidentiality: Yes   I discussed the limitations of telemedicine and the availability of in person appointments.  Discussed there is a possibility of technology failure and discussed alternative modes of communication if that failure occurs.  Patient and/or legal guardian expressed understanding and consented to Telemedicine visit: Yes   Presenting Concerns: Patient and/or family reports the following symptoms/concerns: The patient reports that he has been doing the same since his last follow-up appointment. He explained that he is still not speaking to his sister. He noted his roommate is out of town and while he is happy she gets a break it has left him feeling lonely. The patient discussed financial and familial stressors impacting his life. He shared that he feels he is still grieving the loss of his mother. The patient shared that his neighbor has been supportive of him in his time of need and she has brought him over some groceries. The patient shared he is looking for work and hopes by staying busy it will help. He discussed his dog Marlis Edelson and shared that he is able to keep going everyday in part because of her. He noted that he is looking forward to warmer dry weather so he can spend some time outside with his dogs. The patient denied  any suicidal or homicidal thoughts.  Duration of problem: Years; Severity of problem: moderate  Patient and/or Family's Strengths/Protective Factors: Social connections and Concrete supports in place (healthy food, safe environments, etc.)  Goals Addressed: Patient will:  Reduce symptoms of: agitation, anxiety, depression, insomnia, and stress   Increase knowledge and/or ability of: coping skills, healthy habits, self-management skills, and stress reduction   Demonstrate ability to: Increase healthy adjustment to current life circumstances and Increase adequate support systems for patient/family  Progress towards Goals: Ongoing  Interventions: Interventions utilized:  CBT Cognitive Behavioral Therapywas utilized by the clinician during today's follow up session. Clinician met with patient to identify needs related to stressors and functioning, and assess and monitor for signs and symptoms of anxiety and depression, and assess safety. The clinician processed with the patient how they have been doing since the last follow-up session.  Clinician intervened with positive regard and optimism to validate client's emotions, and supported client in exploring ways to address his Social Determinates of Health through access to community resources. The session ended with scheduling. Standardized Assessments completed:  Due to time restraints will access at next weeks follow-up session.      Assessment: Patient currently experiencing see above.   Patient may benefit from see above.  Plan: Follow up with behavioral health clinician on : 12/09/2021 at 3:00 PM  Behavioral recommendations:  Referral(s): [place] (In Clinic)  I discussed the assessment and treatment plan with the patient and/or parent/guardian. They were provided an opportunity to ask questions and all were answered. They agreed with the plan and demonstrated an understanding of the instructions.  They  were advised to call back or seek an in-person evaluation if the symptoms worsen or if the condition fails to improve as anticipated.  Lesli Albee, LCSWA

## 2021-12-03 ENCOUNTER — Other Ambulatory Visit: Payer: Self-pay | Admitting: Gerontology

## 2021-12-03 ENCOUNTER — Other Ambulatory Visit: Payer: Self-pay

## 2021-12-03 DIAGNOSIS — F339 Major depressive disorder, recurrent, unspecified: Secondary | ICD-10-CM

## 2021-12-03 DIAGNOSIS — Z8659 Personal history of other mental and behavioral disorders: Secondary | ICD-10-CM

## 2021-12-07 ENCOUNTER — Other Ambulatory Visit: Payer: Self-pay | Admitting: Gerontology

## 2021-12-07 ENCOUNTER — Other Ambulatory Visit: Payer: Self-pay

## 2021-12-07 DIAGNOSIS — F339 Major depressive disorder, recurrent, unspecified: Secondary | ICD-10-CM

## 2021-12-07 DIAGNOSIS — Z8659 Personal history of other mental and behavioral disorders: Secondary | ICD-10-CM

## 2021-12-07 MED ORDER — SERTRALINE HCL 50 MG PO TABS
150.0000 mg | ORAL_TABLET | Freq: Every day | ORAL | 0 refills | Status: DC
  Filled 2021-12-07: qty 90, 30d supply, fill #0

## 2021-12-07 MED FILL — Trazodone HCl Tab 100 MG: ORAL | 30 days supply | Qty: 30 | Fill #0 | Status: AC

## 2021-12-08 ENCOUNTER — Other Ambulatory Visit: Payer: Self-pay

## 2021-12-08 DIAGNOSIS — R748 Abnormal levels of other serum enzymes: Secondary | ICD-10-CM

## 2021-12-08 DIAGNOSIS — E559 Vitamin D deficiency, unspecified: Secondary | ICD-10-CM

## 2021-12-08 DIAGNOSIS — E538 Deficiency of other specified B group vitamins: Secondary | ICD-10-CM

## 2021-12-09 ENCOUNTER — Other Ambulatory Visit: Payer: Self-pay | Admitting: Gerontology

## 2021-12-09 ENCOUNTER — Ambulatory Visit: Payer: Self-pay | Admitting: Licensed Clinical Social Worker

## 2021-12-09 ENCOUNTER — Other Ambulatory Visit: Payer: Self-pay

## 2021-12-09 DIAGNOSIS — F331 Major depressive disorder, recurrent, moderate: Secondary | ICD-10-CM

## 2021-12-09 DIAGNOSIS — J45909 Unspecified asthma, uncomplicated: Secondary | ICD-10-CM

## 2021-12-09 DIAGNOSIS — F1010 Alcohol abuse, uncomplicated: Secondary | ICD-10-CM

## 2021-12-09 DIAGNOSIS — F411 Generalized anxiety disorder: Secondary | ICD-10-CM

## 2021-12-09 DIAGNOSIS — F339 Major depressive disorder, recurrent, unspecified: Secondary | ICD-10-CM

## 2021-12-09 LAB — B12 AND FOLATE PANEL
Folate: 10.8 ng/mL (ref >3.0)
Vitamin B-12: 515 pg/mL (ref 232–1245)

## 2021-12-09 LAB — HEPATIC FUNCTION PANEL
ALT: 31 IU/L (ref 0–44)
AST: 129 IU/L — ABNORMAL HIGH (ref 0–40)
Albumin: 4.5 g/dL (ref 4.0–5.0)
Alkaline Phosphatase: 92 IU/L (ref 44–121)
Bilirubin Total: 2 mg/dL — ABNORMAL HIGH (ref 0.0–1.2)
Bilirubin, Direct: 0.45 mg/dL — ABNORMAL HIGH (ref 0.00–0.40)
Total Protein: 7.9 g/dL (ref 6.0–8.5)

## 2021-12-09 LAB — VITAMIN D 25 HYDROXY (VIT D DEFICIENCY, FRACTURES): Vit D, 25-Hydroxy: 21.3 ng/mL — ABNORMAL LOW (ref 30.0–100.0)

## 2021-12-09 NOTE — BH Specialist Note (Signed)
Integrated Behavioral Health via Telemedicine Visit  12/09/2021 Cameron [name] [id number]   Total time: 78  Referring Provider: Carlyon Shadow, NP Patient/Family location: The patient's home address [facility] Provider location: [facility] All persons participating in visit: Cameron [name] Types of Service: Telephone visit  I connected with Cameron [name] via  Telephone or Video Enabled Telemedicine Application  (Video is Caregility application) and verified that I am speaking with the correct person using two identifiers. Discussed confidentiality: Yes   I discussed the limitations of telemedicine and the availability of in person appointments.  Discussed there is a possibility of technology failure and discussed alternative modes of communication if that failure occurs.  Patient and/or legal guardian expressed understanding and consented to Telemedicine visit: Yes   Presenting Concerns: Patient and/or family reports the following symptoms/concerns: The patient states that he has been doing the same since his last follow-up appointment. He shared that he has applied for emergency SNAP benefits and hopes to hear back form them soon. He shared that his neighbor has brought him over some groceries that should help him make it until his EBT card arrives in the mail. The patient discussed other financial stressors that are impacting his life currently. He noted that his roommate has went out of town and he has ben spending more time alone. 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 Cameron [name] discussed family stressors that have increased since the loss of his mother. He shared that he has to make a decision to keep fighting for social security disability or try to return to find a job. He discussed several potential job opportunities that he  is considering. The patient denied any suicidal or homicidal thoughts.  Duration of problem: Years; Severity of problem: moderate  Patient and/or Family's Strengths/Protective Factors: Concrete supports in place (healthy food, safe environments, etc.)  Goals Addressed: Patient will:  Reduce symptoms of: agitation, anxiety, and depression   Increase knowledge and/or ability of: coping skills, healthy habits, self-management skills, and stress reduction   Demonstrate ability to: Increase healthy adjustment to current life circumstances and Increase adequate support systems for patient/family  Progress towards Goals: Ongoing  Interventions: Interventions utilized:  CBT Cognitive Behavioral Therapywas utilized by the clinician during today's follow up session. Clinician met with patient to identify needs related to stressors and functioning, and assess and monitor for signs and symptoms of anxiety and depression, and assess safety. The clinician processed with the patient how they have been doing since the last follow-up session. Clinician measured the patient's anxiety and depression on a numerical scale. Clinician encouraged the patient to continue to work on calming techniques (eg. paced breathing, deep muscle relaxation, and calming imagery) as a strategy for responding appropriately to anxiety and the urge to avoid situations or self-isolate when they occur and move towards increasing the patients self regulation. The session ended with scheduling.   Standardized Assessments completed: GAD-7 and PHQ 9 GAD-7=  20 PHQ-9 = 13  Assessment: Patient currently experiencing see above.   Patient may benefit from see above.  Plan: Follow up with behavioral health clinician on : 12/10/2021 at 2:00 PM  Behavioral recommendations:  Referral(s): [place] (In Clinic)  I discussed the assessment and treatment plan with the patient and/or parent/guardian. They were  provided an opportunity to ask questions  and all were answered. They agreed with the plan and demonstrated an understanding of the instructions.   They were advised to call back or seek an in-person evaluation if the symptoms worsen or if the condition fails to improve as anticipated.  Lesli Albee, LCSWA

## 2021-12-13 ENCOUNTER — Other Ambulatory Visit: Payer: Self-pay

## 2021-12-14 ENCOUNTER — Other Ambulatory Visit: Payer: Self-pay

## 2021-12-14 ENCOUNTER — Ambulatory Visit: Payer: Self-pay | Admitting: Gerontology

## 2021-12-14 MED FILL — Albuterol Sulfate Inhal Aero 108 MCG/ACT (90MCG Base Equiv): RESPIRATORY_TRACT | 17 days supply | Qty: 6.7 | Fill #0 | Status: AC

## 2021-12-16 ENCOUNTER — Ambulatory Visit: Payer: Self-pay | Admitting: Licensed Clinical Social Worker

## 2021-12-16 ENCOUNTER — Ambulatory Visit: Payer: Self-pay

## 2021-12-21 ENCOUNTER — Ambulatory Visit: Payer: Self-pay | Admitting: Licensed Clinical Social Worker

## 2022-01-14 ENCOUNTER — Other Ambulatory Visit: Payer: Self-pay

## 2022-01-14 MED FILL — Albuterol Sulfate Inhal Aero 108 MCG/ACT (90MCG Base Equiv): RESPIRATORY_TRACT | 17 days supply | Qty: 6.7 | Fill #1 | Status: AC

## 2022-01-18 ENCOUNTER — Ambulatory Visit: Payer: Self-pay | Admitting: Licensed Clinical Social Worker

## 2022-01-18 ENCOUNTER — Other Ambulatory Visit: Payer: Self-pay

## 2022-01-18 DIAGNOSIS — F339 Major depressive disorder, recurrent, unspecified: Secondary | ICD-10-CM

## 2022-01-18 DIAGNOSIS — F411 Generalized anxiety disorder: Secondary | ICD-10-CM

## 2022-01-18 DIAGNOSIS — F1010 Alcohol abuse, uncomplicated: Secondary | ICD-10-CM

## 2022-01-18 NOTE — BH Specialist Note (Signed)
Integrated Behavioral Health via Telemedicine Visit  01/18/2022 Cameron [name] [id number]  Number of [place] Clinician visits: No data recorded Session Start time: No data recorded  Session End time: No data recorded Total time in minutes: No data recorded  Referring Provider: Carlyon Shadow, NP Patient/Family location: The patient's home address [facility] Provider location: [facility] [place] All persons participating in visit: Cameron [name] and Cameron [name], MSW, LCSW-A Types of Service: Telephone visit  I connected with Cameron [name] via  Telephone or Video Enabled Telemedicine Application  (Video is Caregility application) and verified that I am speaking with the correct person using two identifiers. Discussed confidentiality: Yes   I discussed the limitations of telemedicine and the availability of in person appointments.  Discussed there is a possibility of technology failure and discussed alternative modes of communication if that failure occurs.  Patient and/or legal guardian expressed understanding and consented to Telemedicine visit: Yes   Presenting Concerns: Patient and/or family reports the following symptoms/concerns: The patient reports that he has been doing okay since his last follow-up appointment. He discussed that he worked for a [employer] for two weeks before they cut his hours to one shift per week, then they closed completely. He explained that he returned to his previous employer and received mixed messages on whether he would be hired again by [employer]ilities [employer]. He is still determining his next steps and reports feeling increasingly frustrated. The patient reports that he decreased taking sertraline 50 MG three times per day to 50 MG of sertraline twice per day due to experiencing sexual side effects such as difficulty achieving an erection and premature ejaculation. The patient shared that when he  decreased his medication the sexual side effects lessened. He noted he continues to take Trazodone 100 MG nightly as prescribed. The patient denied any suicidal or homicidal thoughts.   Duration of problem: Years; Severity of problem: moderate  Patient and/or Family's Strengths/Protective Factors: Social connections, Concrete supports in place (healthy food, safe environments, etc.), and Sense of purpose  Goals Addressed: Patient will:  Reduce symptoms of: agitation, anxiety, depression, insomnia, and stress   Increase knowledge and/or ability of: coping skills, healthy habits, self-management skills, and stress reduction   Demonstrate ability to: Increase healthy adjustment to current life circumstances and Increase adequate support systems for patient/family  Progress towards Goals: Ongoing  Interventions: Interventions utilized:  CBT Cognitive Behavioral Therapy was utilized by the clinician during today's follow up session. Clinician met with patient to identify needs related to stressors and functioning, and assess and monitor for signs and symptoms of anxiety and depression, and assess safety. The clinician processed with the patient how they have been doing since the last follow-up session. Clinician measured the patient's anxiety and depression on a numerical scale. Clinician encouraged the patient to continue to work on calming techniques (e.g.. paced breathing, deep muscle relaxation, and calming imagery) as a strategy for responding appropriately to anxiety and the urge to avoid situations or self-medicate when they occur and move towards increasing the patients self regulation. Clinician offered to take the patients concerns regarding his psychotropic medication to the next psychiatric case consultation on 02/01/2022.  The session ended with scheduling.  Standardized Assessments completed: GAD-7 and PHQ 9 PHQ-9 = 13 GAD-7 = 17  Assessment: Patient currently experiencing see above.    Patient may benefit from see above.  Plan: Follow up with behavioral health clinician on : 01/25/2022 at 3:00  PM  Behavioral recommendations:  Referral(s): [place] (In Clinic)  I discussed the assessment and treatment plan with the patient and/or parent/guardian. They were provided an opportunity to ask questions and all were answered. They agreed with the plan and demonstrated an understanding of the instructions.   They were advised to call back or seek an in-person evaluation if the symptoms worsen or if the condition fails to improve as anticipated.  Lesli Albee, LCSWA

## 2022-01-19 ENCOUNTER — Other Ambulatory Visit: Payer: Self-pay | Admitting: Gerontology

## 2022-01-19 ENCOUNTER — Other Ambulatory Visit: Payer: Self-pay

## 2022-01-19 DIAGNOSIS — Z8659 Personal history of other mental and behavioral disorders: Secondary | ICD-10-CM

## 2022-01-19 DIAGNOSIS — F339 Major depressive disorder, recurrent, unspecified: Secondary | ICD-10-CM

## 2022-01-19 MED FILL — Trazodone HCl Tab 100 MG: ORAL | 30 days supply | Qty: 30 | Fill #0 | Status: AC

## 2022-01-19 MED FILL — Sertraline HCl Tab 50 MG: ORAL | 30 days supply | Qty: 90 | Fill #0 | Status: AC

## 2022-01-20 ENCOUNTER — Other Ambulatory Visit: Payer: Self-pay

## 2022-01-25 ENCOUNTER — Ambulatory Visit: Payer: Self-pay | Admitting: Licensed Clinical Social Worker

## 2022-02-01 ENCOUNTER — Ambulatory Visit: Payer: Self-pay | Admitting: Licensed Clinical Social Worker

## 2022-02-01 ENCOUNTER — Telehealth: Payer: Self-pay | Admitting: Licensed Clinical Social Worker

## 2022-02-01 NOTE — Telephone Encounter (Signed)
Called the patient twice during today's scheduled appointment; no answer, left a voicemail with the clinic contact information so they may reschedule.   

## 2022-02-02 ENCOUNTER — Ambulatory Visit: Payer: Self-pay | Admitting: Licensed Clinical Social Worker

## 2022-02-02 ENCOUNTER — Other Ambulatory Visit: Payer: Self-pay

## 2022-02-02 DIAGNOSIS — F331 Major depressive disorder, recurrent, moderate: Secondary | ICD-10-CM

## 2022-02-02 DIAGNOSIS — F411 Generalized anxiety disorder: Secondary | ICD-10-CM

## 2022-02-02 NOTE — BH Specialist Note (Signed)
Integrated Behavioral Health via Telemedicine Visit ? ?02/02/2022 ?Cameron [name] ?[id number] ? ? ?Referring Provider: Carlyon Shadow, NP ?Patient/Family location: The patient's home ?[facility] Provider location: [facility] [place] ?All persons participating in visit: Jamaul L. Morene Rankins [name] and Jerrilyn Cairo, MSW, LCSW-A ?Types of Service: Telephone visit ? ?I connected with [name] via  Telephone or Video Enabled Telemedicine Application  (Video is Caregility application) and verified that I am speaking with the correct person using two identifiers. Discussed confidentiality: Yes  ? ?I discussed the limitations of telemedicine and the availability of in person appointments.  Discussed there is a possibility of technology failure and discussed alternative modes of communication if that failure occurs. ? ?I discussed that engaging in this telemedicine visit, they consent to the provision of behavioral healthcare and the services will be billed under their insurance. ? ?Patient and/or legal guardian expressed understanding and consented to Telemedicine visit: Yes  ? ?Presenting Concerns: ?Patient and/or family reports the following symptoms/concerns: The patient reports that he has been doing okay since his last follow-up appointment. He noted that he has a new job and is looking forward to working again. He noted that his income may not be enough to cover the expenses needed to he home he inherited from his mother. He explained that the [place] on the home and taxes were in the rears and the [facility] is threatening to move forward with litigation. The patient discussed other financial stressors impacting his life.The patient noted that he has been out walking more since the weather has improved and he felt it helped boost his overall mood. 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 He stated that the process increases his anxiety because is heart rate is sometimes elevated  from walking there and if it is too high he cannot donate or earn money. He noted that he takes his sertraline (Zoloft) 50 MG twice a day, and Trazodone 100 MG at bedtime and feels that his medication improve his mood and help him to sleep. The patient denied any suicidal or homicidal thoughts.  ?Duration of problem: Years; Severity of problem: moderate ? ?Patient and/or Family's Strengths/Protective Factors: ?Social connections, Concrete supports in place (healthy food, safe environments, etc.), and Sense of purpose ? ?Goals Addressed: ?Patient will: ? Reduce symptoms of: agitation, anxiety, depression, and stress  ? Increase knowledge and/or ability of: coping skills, healthy habits, self-management skills, and stress reduction  ? Demonstrate ability to: Increase healthy adjustment to current life circumstances ? ?Progress towards Goals: ?Ongoing ? ?Interventions: ?Interventions utilized:  CBT Cognitive Behavioral Therapywas utilized by the clinician during today's follow up session. Clinician met with patient to identify needs related to stressors and functioning, and assess and monitor for signs and symptoms of anxiety and depression, and assess safety. The clinician processed with the patient how they have been doing since the last follow-up session. Clinician measured the patient's anxiety and depression on a numerical scale. Clinician checked in with the patient regarding his compliance with the physician?s prescription for psychotropic medication to monitor for the medication?s effectiveness and determined the patient denied any side effects monitored for the medication?s effectiveness and side effect and he reported that the medications have been beneficial to him in reducing his experience of anxiety and depression symptoms and improved his ability to fall asleep.  ?Standardized Assessments completed: GAD-7 and PHQ 9 ?PHQ-9 = 13 ?GAD-7 = 17 ? ?Assessment: ?Patient currently experiencing see above.   ? ?  Patient may benefit from see above. ? ?Plan: ?Follow up with behavioral health clinician on : 02/10/2022  at 11:00 AM  ?Behavioral recommendations:  ?Referral(s): [place] (In Clinic) ? ?I discussed the assessment and treatment plan with the patient and/or parent/guardian. They were provided an opportunity to ask questions and all were answered. They agreed with the plan and demonstrated an understanding of the instructions. ?  ?They were advised to call back or seek an in-person evaluation if the symptoms worsen or if the condition fails to improve as anticipated. ? ?Lesli Albee, LCSWA ?

## 2022-02-10 ENCOUNTER — Other Ambulatory Visit: Payer: Self-pay

## 2022-02-10 ENCOUNTER — Ambulatory Visit: Payer: Self-pay | Admitting: Licensed Clinical Social Worker

## 2022-02-14 ENCOUNTER — Other Ambulatory Visit: Payer: Self-pay

## 2022-02-14 ENCOUNTER — Other Ambulatory Visit: Payer: Self-pay | Admitting: Gerontology

## 2022-02-14 DIAGNOSIS — Z8659 Personal history of other mental and behavioral disorders: Secondary | ICD-10-CM

## 2022-02-14 DIAGNOSIS — F339 Major depressive disorder, recurrent, unspecified: Secondary | ICD-10-CM

## 2022-02-14 MED FILL — Albuterol Sulfate Inhal Aero 108 MCG/ACT (90MCG Base Equiv): RESPIRATORY_TRACT | 17 days supply | Qty: 6.7 | Fill #2 | Status: AC

## 2022-02-15 ENCOUNTER — Other Ambulatory Visit: Payer: Self-pay

## 2022-02-15 MED FILL — Trazodone HCl Tab 100 MG: ORAL | 30 days supply | Qty: 30 | Fill #0 | Status: AC

## 2022-02-15 MED FILL — Sertraline HCl Tab 50 MG: ORAL | 30 days supply | Qty: 60 | Fill #0 | Status: AC

## 2022-02-22 ENCOUNTER — Ambulatory Visit: Payer: Self-pay | Admitting: Licensed Clinical Social Worker

## 2022-03-01 ENCOUNTER — Ambulatory Visit: Payer: Self-pay | Admitting: Licensed Clinical Social Worker

## 2022-03-03 ENCOUNTER — Ambulatory Visit: Payer: Self-pay | Admitting: Licensed Clinical Social Worker

## 2022-03-03 DIAGNOSIS — F331 Major depressive disorder, recurrent, moderate: Secondary | ICD-10-CM

## 2022-03-03 DIAGNOSIS — F411 Generalized anxiety disorder: Secondary | ICD-10-CM
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
## 2022-03-12 ENCOUNTER — Emergency Department
Admission: EM | Admit: 2022-03-12 | Discharge: 2022-03-12 | Disposition: A | Discharge status: Home / Self Care | Payer: 59 | Attending: Emergency Medicine | Admitting: Emergency Medicine

## 2022-03-12 DIAGNOSIS — R600 Localized edema: Secondary | ICD-10-CM | POA: Insufficient documentation

## 2022-03-12 DIAGNOSIS — M7989 Other specified soft tissue disorders: Secondary | ICD-10-CM | POA: Diagnosis present

## 2022-03-12 LAB — CBC
HCT: 44 % (ref 39.0–52.0)
Hemoglobin: 14.6 g/dL (ref 13.0–17.0)
MCH: 31.5 pg (ref 26.0–34.0)
MCHC: 33.2 g/dL (ref 30.0–36.0)
MCV: 94.8 fL (ref 80.0–100.0)
Platelets: 182 10*3/uL (ref 150–400)
RBC: 4.64 MIL/uL (ref 4.22–5.81)
RDW: 14 % (ref 11.5–15.5)
WBC: 7.4 10*3/uL (ref 4.0–10.5)
nRBC: 0 % (ref 0.0–0.2)

## 2022-03-12 LAB — BASIC METABOLIC PANEL
Anion gap: 8 (ref 5–15)
BUN: <5 mg/dL — ABNORMAL LOW (ref 6–20)
CO2: 26 mmol/L (ref 22–32)
Calcium: 8.2 mg/dL — ABNORMAL LOW (ref 8.9–10.3)
Chloride: 104 mmol/L (ref 98–111)
Creatinine, Ser: 0.9 mg/dL (ref 0.61–1.24)
GFR, Estimated: >60 mL/min (ref >60)
Glucose, Bld: 119 mg/dL — ABNORMAL HIGH (ref 70–99)
Potassium: 3.5 mmol/L (ref 3.5–5.1)
Sodium: 138 mmol/L (ref 135–145)

## 2022-03-12 MED ORDER — POTASSIUM CHLORIDE CRYS ER 20 MEQ PO TBCR: 20.0000 meq | EXTENDED_RELEASE_TABLET | Freq: Two times a day (BID) | ORAL | 0 refills | Status: DC

## 2022-03-12 MED ORDER — FUROSEMIDE 20 MG PO TABS: 20.0000 mg | ORAL_TABLET | Freq: Two times a day (BID) | ORAL | 0 refills | Status: DC

## 2022-03-12 NOTE — ED Notes (Signed)
Pt discharge signature placed in medical record bin.  ?
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
## 2022-03-12 NOTE — ED Triage Notes (Signed)
Patient to ER via POV with worsening leg swelling. Pitting edema, redness in both legs, tightness and tingling. Reports symptoms started 10 days ago. Denies SHOB/ CP.  ?

## 2022-03-16 ENCOUNTER — Telehealth: Payer: Self-pay | Admitting: Licensed Clinical Social Worker

## 2022-03-16 ENCOUNTER — Ambulatory Visit: Payer: Self-pay | Admitting: Licensed Clinical Social Worker

## 2022-03-16 NOTE — Telephone Encounter (Signed)
Called the patient twice during today's scheduled appointment; no answer, left a voicemail with the clinic contact information so they may reschedule.   

## 2022-03-17 ENCOUNTER — Ambulatory Visit: Payer: Self-pay | Admitting: Licensed Clinical Social Worker

## 2022-03-17 DIAGNOSIS — F411 Generalized anxiety disorder: Secondary | ICD-10-CM

## 2022-03-17 DIAGNOSIS — F331 Major depressive disorder, recurrent, moderate: Secondary | ICD-10-CM
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
## 2022-03-25 ENCOUNTER — Other Ambulatory Visit: Payer: Self-pay

## 2022-03-25 ENCOUNTER — Other Ambulatory Visit: Payer: Self-pay | Admitting: Gerontology

## 2022-03-25 DIAGNOSIS — I10 Essential (primary) hypertension: Secondary | ICD-10-CM

## 2022-03-25 DIAGNOSIS — Z8659 Personal history of other mental and behavioral disorders: Secondary | ICD-10-CM

## 2022-03-25 DIAGNOSIS — F339 Major depressive disorder, recurrent, unspecified: Secondary | ICD-10-CM

## 2022-03-25 MED FILL — Albuterol Sulfate Inhal Aero 108 MCG/ACT (90MCG Base Equiv): RESPIRATORY_TRACT | 17 days supply | Qty: 6.7 | Fill #3 | Status: AC

## 2022-03-30 ENCOUNTER — Other Ambulatory Visit: Payer: Self-pay

## 2022-03-30 MED FILL — Trazodone HCl Tab 100 MG: ORAL | 15 days supply | Qty: 15 | Fill #0 | Status: AC

## 2022-03-30 MED FILL — Amlodipine Besylate Tab 5 MG (Base Equivalent): ORAL | 30 days supply | Qty: 30 | Fill #0 | Status: AC

## 2022-03-30 MED FILL — Sertraline HCl Tab 50 MG: ORAL | 30 days supply | Qty: 60 | Fill #0 | Status: AC

## 2022-03-31 ENCOUNTER — Other Ambulatory Visit: Payer: Self-pay

## 2022-04-04 ENCOUNTER — Other Ambulatory Visit: Payer: Self-pay

## 2022-04-06 ENCOUNTER — Other Ambulatory Visit: Payer: Self-pay

## 2022-04-06 ENCOUNTER — Telehealth: Payer: Self-pay | Admitting: Licensed Clinical Social Worker

## 2022-04-06 NOTE — Telephone Encounter (Signed)
Called the patient to reschedule his missed IBH follow-up appointment. No answer; left a voicemail with contact information for [facility] so he may reschedule.  ?

## 2022-04-07 ENCOUNTER — Other Ambulatory Visit: Payer: Self-pay

## 2022-05-26 ENCOUNTER — Other Ambulatory Visit: Payer: Self-pay

## 2022-07-06 ENCOUNTER — Ambulatory Visit: Payer: Self-pay | Admitting: Licensed Clinical Social Worker

## 2022-07-06 DIAGNOSIS — F331 Major depressive disorder, recurrent, moderate: Secondary | ICD-10-CM

## 2022-07-06 DIAGNOSIS — F411 Generalized anxiety disorder: Secondary | ICD-10-CM

## 2022-07-06 NOTE — BH Specialist Note (Signed)
Integrated Behavioral Health via Telemedicine Visit  07/06/2022 Cameron [name] [id number]   Referring Provider: Elizabeth Chioma, NP Patient/Family location: The Patient's Home [facility] Provider location: [facility] [place]  All persons participating in visit: Tavonte L. Moffat [name] and Heather Pruitt, LCSW-A Types of Service: Telephone visit  I connected with Cameron [name] and/or Cameron [name]'s via  Telephone or Video Enabled Telemedicine Application  (Video is Caregility application) and verified that I am speaking with the correct person using two identifiers. Discussed confidentiality: Yes   I discussed the limitations of telemedicine and the availability of in person appointments.  Discussed there is a possibility of technology failure and discussed alternative modes of communication if that failure occurs.  I discussed that engaging in this telemedicine visit, they consent to the provision of behavioral healthcare and the services will be billed under their insurance.  Patient and/or legal guardian expressed understanding and consented to Telemedicine visit: Yes   Presenting Concerns: Patient and/or family reports the following symptoms/concerns: The patient reports that he has been doing okay since his last follow-up appointment. He discussed ongoing familial and financial stressors impacting his life. He noted that he has been working, but recently his hours have been reduced, and he is currently looking for additional work. The patient shared his frustrations regarding his work situation. He noted he continues to struggle with feeling depressed more days than not in a week. He stated that he would like his prescriptions refilled for sertraline and trazodone and requested the Clinician message his primary care provider with his request. The patient noted that he has a roommate and feels less lonely having someone else in the home. The patient stated that  his roommate has also been a source of support in caring for his late mother's dog. He shared that he continues to grieve the loss of his mother and noted it has been a year since she passed. The patient denied any suicidal or homicidal thoughts.  Duration of problem: Years; Severity of problem: moderate  Patient and/or Family's Strengths/Protective Factors: Social connections, Concrete supports in place (healthy food, safe environments, etc.), Sense of purpose, and Physical Health (exercise, healthy diet, medication compliance, etc.)  Goals Addressed: Patient will:  Reduce symptoms of: agitation, anxiety, depression, insomnia, and stress   Increase knowledge and/or ability of: coping skills, healthy habits, self-management skills, and stress reduction   Demonstrate ability to: Increase healthy adjustment to current life circumstances  Progress towards Goals: Ongoing  Interventions: Interventions utilized:  CBT Cognitive Behavioral Therapywas utilized by the clinician during today's follow up session. Clinician met with the patient to identify needs related to stressors and functioning, assess and monitor for signs and symptoms of anxiety and depression, and assess safety. The Clinician processed with the patient how they had been doing since the last follow-up session. Clinician provided a safe, judgment-free space for the patient to vent his frustrations regarding his current life circumstances. Clinician messaged the patient's primary care provider, Elizabeth Chioma, NP and relayed the patient's psychotropic medication refill request. The Clinician encouraged the patient to take their medication at the same time every day, exactly as prescribed, to reach its full intended effect. The Clinician reassured the patient that it was okay to continue feeling occasional grief over his mother's loss, especially during meaningful dates. The session ended with scheduling.  Standardized Assessments  completed: GAD-7 and PHQ 9 GAD-7= 14 PHQ-9= 10  Assessment: Patient currently experiencing see above.     Patient may benefit from see above.  Plan: Follow up with behavioral health clinician on : 07/14/2022 at 2:00 PM  Behavioral recommendations:  Referral(s): [facility] (In Clinic)  I discussed the assessment and treatment plan with the patient and/or parent/guardian. They were provided an opportunity to ask questions and all were answered. They agreed with the plan and demonstrated an understanding of the instructions.   They were advised to call back or seek an in-person evaluation if the symptoms worsen or if the condition fails to improve as anticipated.  Heather J Pruitt, LCSWA 

## 2022-07-12 ENCOUNTER — Other Ambulatory Visit: Payer: Self-pay | Admitting: Gerontology

## 2022-07-12 ENCOUNTER — Other Ambulatory Visit: Payer: Self-pay

## 2022-07-12 DIAGNOSIS — Z8659 Personal history of other mental and behavioral disorders: Secondary | ICD-10-CM

## 2022-07-12 DIAGNOSIS — F339 Major depressive disorder, recurrent, unspecified: Secondary | ICD-10-CM

## 2022-07-12 MED ORDER — TRAZODONE HCL 100 MG PO TABS
100.0000 mg | ORAL_TABLET | Freq: Every day | ORAL | 0 refills | Status: DC
  Filled 2022-07-12: qty 30, 30d supply, fill #0

## 2022-07-12 MED ORDER — SERTRALINE HCL 50 MG PO TABS
50.0000 mg | ORAL_TABLET | Freq: Two times a day (BID) | ORAL | 0 refills | Status: DC
  Filled 2022-07-12: qty 60, 30d supply, fill #0

## 2022-07-14 ENCOUNTER — Ambulatory Visit: Payer: Self-pay | Admitting: Licensed Clinical Social Worker

## 2022-07-14 DIAGNOSIS — F411 Generalized anxiety disorder: Secondary | ICD-10-CM

## 2022-07-14 DIAGNOSIS — F331 Major depressive disorder, recurrent, moderate: Secondary | ICD-10-CM

## 2022-07-14 NOTE — BH Specialist Note (Signed)
Integrated Behavioral Health Follow Up Telephone visit  MRN: [id number] Name: Cameron [name]   Types of Service: Telephone visit  Interpretor:No. Interpretor Name and Language: N/A  Subjective: Cameron [name] is a 51 y.o. male accompanied by  himself Patient was referred by Cameron Horn, NP for mental health. Patient reports the following symptoms/concerns: The patient reports that he has been doing the same since his last follow-up appointment. He noted that he is actively searching for work today. He shared feeling frustrated because businesses require potential employees to complete applications online. He said that he struggles at times with access to the Internet. 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 The patient discussed other family and situational stressors impacting his life currently. He noted that it is coming up on the first anniversary of his mother's passing, and he is feeling lonely. He shared that his roommate, Cameron [name], provides emotional support for him. Overall, the patient stated he is doing okay and looking forward to returning to work. The patient denied any suicidal or homicidal thoughts. Duration of problem: Years; Severity of problem: moderate  Life Context: Family and Social: see above School/Work: see above Self-Care: see above Life Changes: see above  Patient and/or Family's Strengths/Protective Factors: Social connections, Concrete supports in place (healthy food, safe environments, etc.), and Sense of purpose  Goals Addressed: Patient will:  Reduce symptoms of: agitation, anxiety, depression, insomnia, and stress   Increase knowledge and/or ability of: coping skills, healthy habits, self-management skills, and stress reduction   Demonstrate ability to: Increase healthy adjustment to current life circumstances  Progress  towards Goals: Ongoing  Interventions: Interventions utilized:  CBT Cognitive Behavioral Therapywas utilized by the clinician during today's follow up session. The clinician processed with the patient how they had been doing since the last follow-up session. The clinician assisted the patient in identifying anxiety-coping skills that they have learned in the past and with which they have successfully managed their anxiety. The clinician assigned the patient the task of consistently using successful coping mechanisms from the past to deal with present anxiety-related difficulties. The clinician encouraged the patient to journal to help him cope with his current life circumstances. In addition, the clinician suggested the patient focus on intentional self-care. The session ended with scheduling. Standardized Assessments completed: Due to time constraints will complete at next session  Assessment: Patient currently experiencing see above.   Patient may benefit from see above.  Plan: Follow up with behavioral health clinician on : July 20, 2022 at 4:00 PM Behavioral recommendations:  Referral(s): Integrated [facility] (In Clinic) "From scale of 1-10, how likely are you to follow plan?":   Cameron [name], LCSWA

## 2022-07-20 ENCOUNTER — Telehealth: Payer: Self-pay | Admitting: Licensed Clinical Social Worker

## 2022-07-20 ENCOUNTER — Telehealth: Payer: Self-pay | Admitting: Gerontology

## 2022-07-20 ENCOUNTER — Ambulatory Visit: Payer: Self-pay | Admitting: Licensed Clinical Social Worker

## 2022-07-20 NOTE — Telephone Encounter (Signed)
Called pt to reschedule appt from today to next week per [facility]. No answer. Vmail full.

## 2022-07-20 NOTE — Telephone Encounter (Signed)
Called the patient twice during today's scheduled appointment; no answer, unable to leave a voicemail because the mailbox was full.

## 2022-07-28 ENCOUNTER — Other Ambulatory Visit: Payer: Self-pay | Admitting: Gerontology

## 2022-07-28 ENCOUNTER — Telehealth: Payer: Self-pay | Admitting: Emergency Medicine

## 2022-07-28 ENCOUNTER — Other Ambulatory Visit: Payer: Self-pay

## 2022-07-28 DIAGNOSIS — J45909 Unspecified asthma, uncomplicated: Secondary | ICD-10-CM

## 2022-07-28 MED ORDER — ALBUTEROL SULFATE HFA 108 (90 BASE) MCG/ACT IN AERS
2.0000 | INHALATION_SPRAY | RESPIRATORY_TRACT | 3 refills | Status: DC | PRN
  Filled 2022-07-28: qty 6.7, 17d supply, fill #0

## 2022-07-28 NOTE — Telephone Encounter (Signed)
Called patient re: refill request received for Albuterol inhaler. Patient has been seeing Dr. Sampson Goon at [facility] since he has active insurance now. Patient will contact them for refill.  Advised patient that since he has active insurance, he will no longer be eligible for services at [facility]. Patient agreed and voiced understanding.

## 2022-07-29 ENCOUNTER — Other Ambulatory Visit: Payer: Self-pay

## 2022-08-16 ENCOUNTER — Ambulatory Visit: Payer: Self-pay | Admitting: Licensed Clinical Social Worker

## 2022-08-16 DIAGNOSIS — F331 Major depressive disorder, recurrent, moderate: Secondary | ICD-10-CM

## 2022-08-16 DIAGNOSIS — Z8659 Personal history of other mental and behavioral disorders: Secondary | ICD-10-CM

## 2022-08-16 DIAGNOSIS — F411 Generalized anxiety disorder: Secondary | ICD-10-CM

## 2022-08-16 NOTE — BH Specialist Note (Unsigned)
Integrated Behavioral Health via Telemedicine Visit  08/16/2022 Cameron [name] [id number]  Number of [place] Clinician visits: No data recorded Session Start time: No data recorded  Session End time: No data recorded Total time in minutes: No data recorded  Referring Provider: The Carlyon Shadow, NP  Patient/Family location: The Patient's Home [facility] Provider location: [facility] [place] All persons participating in visit: [name] and Jerrilyn Cairo, LCSW-A Types of Service: Telephone visit  I connected with [name] via  Telephone or Video Enabled Telemedicine Application  (Video is Caregility application) and verified that I am speaking with the correct person using two identifiers. Discussed confidentiality: Yes   I discussed the limitations of telemedicine and the availability of in person appointments.  Discussed there is a possibility of technology failure and discussed alternative modes of communication if that failure occurs.  Patient and/or legal guardian expressed understanding and consented to Telemedicine visit: Yes   Presenting Concerns: Patient and/or family reports the following symptoms/concerns: The patient reports that he has been doing okay since his last follow-up appointment. He explained that he has medical insurance now, but is not sure if he will have access to mental health therapy or will be able to afford his medications.  Duration of problem: Years; Severity of problem: moderate  Patient and/or Family's Strengths/Protective Factors: Social connections, Concrete supports in place (healthy food, safe environments, etc.), Sense of purpose, and Physical Health (exercise, healthy diet, medication compliance, etc.)  Goals Addressed: Patient will:  Reduce symptoms of: anxiety, depression, insomnia, and stress   Increase knowledge and/or ability of: coping skills, healthy habits, self-management  skills, and stress reduction   Demonstrate ability to: Increase healthy adjustment to current life circumstances  Progress towards Goals: Ongoing  Interventions: Interventions utilized:  CBT Cognitive Behavioral Therapy was utilized by the clinician during today's follow up session. Clinician met with patient to identify needs related to stressors and functioning, and assess and monitor for signs and symptoms of anxiety and depression, and assess safety. The clinician processed with the patient how they have been doing since the last follow-up session. Clinician measured the patient's anxiety and depression on a numerical scale.  Standardized Assessments completed: GAD-7 and PHQ 9 PHQ-9= 15 GAD-7= 16  Assessment: Patient currently experiencing see above.   Patient may benefit from see above.  Plan: Follow up with behavioral health clinician on :  Behavioral recommendations:  Referral(s): [place] (LME/Outside Clinic) Referred to [facility]   I discussed the assessment and treatment plan with the patient and/or parent/guardian. They were provided an opportunity to ask questions and all were answered. They agreed with the plan and demonstrated an understanding of the instructions.   They were advised to call back or seek an in-person evaluation if the symptoms worsen or if the condition fails to improve as anticipated.  Lesli Albee, LCSWA

## 2022-09-29 ENCOUNTER — Telehealth: Payer: Self-pay | Admitting: General Practice

## 2022-09-29 NOTE — Telephone Encounter (Signed)
A user error has taken place.

## 2022-09-29 NOTE — Telephone Encounter (Signed)
Called pt to check in about the end of therapy letter sent on 10/26 due to pt getting insurance. Pt stated he does not have the insurance coverage anymore due to the company shutting down. Pt is interested in coming back to clinic as pt. I explained the paperwork needed to get reinstated. Pt verbalized understanding.

## 2022-11-03 ENCOUNTER — Other Ambulatory Visit: Payer: Self-pay

## 2022-11-03 ENCOUNTER — Encounter: Payer: Self-pay | Admitting: Gerontology

## 2022-11-03 ENCOUNTER — Ambulatory Visit: Payer: Self-pay | Admitting: Gerontology

## 2022-11-03 VITALS — BP 134/80 | HR 91 | Wt 170.4 lb

## 2022-11-03 DIAGNOSIS — M2550 Pain in unspecified joint: Secondary | ICD-10-CM

## 2022-11-03 DIAGNOSIS — F339 Major depressive disorder, recurrent, unspecified: Secondary | ICD-10-CM

## 2022-11-03 DIAGNOSIS — I10 Essential (primary) hypertension: Secondary | ICD-10-CM

## 2022-11-03 DIAGNOSIS — Z0000 Encounter for general adult medical examination without abnormal findings: Secondary | ICD-10-CM

## 2022-11-03 DIAGNOSIS — J45909 Unspecified asthma, uncomplicated: Secondary | ICD-10-CM

## 2022-11-03 DIAGNOSIS — Z8659 Personal history of other mental and behavioral disorders: Secondary | ICD-10-CM

## 2022-11-03 MED ORDER — AMLODIPINE BESYLATE 5 MG PO TABS
ORAL_TABLET | Freq: Every day | ORAL | 3 refills | Status: DC
  Filled 2022-11-03: qty 30, 30d supply, fill #0

## 2022-11-03 MED ORDER — SERTRALINE HCL 50 MG PO TABS
50.0000 mg | ORAL_TABLET | Freq: Two times a day (BID) | ORAL | 0 refills | Status: DC
  Filled 2022-11-03: qty 60, 30d supply, fill #0

## 2022-11-03 MED ORDER — ALBUTEROL SULFATE HFA 108 (90 BASE) MCG/ACT IN AERS
2.0000 | INHALATION_SPRAY | RESPIRATORY_TRACT | 3 refills | Status: DC | PRN
  Filled 2022-11-03: qty 6.7, 25d supply, fill #0

## 2022-11-03 MED ORDER — TRAZODONE HCL 100 MG PO TABS
100.0000 mg | ORAL_TABLET | Freq: Every day | ORAL | 0 refills | Status: DC
  Filled 2022-11-03: qty 30, 30d supply, fill #0

## 2022-11-03 NOTE — Progress Notes (Signed)
Established Patient Office Visit  Subjective   Patient ID: Cameron [name], male    DOB: 03-01-1971  Age: 51 y.o. MRN: [id number]  Chief Complaint  Patient presents with   Pain Management   Annual Exam         HPI  Cameron [name] is a 51 y/o male who has history of Allergy, Asthma, Depression presents for routine follow up visit and medication refill. Cameron [name] states that Cameron [name]'s compliant with his medications, denies side effects and continues to make healthy lifestyle changes. Cameron [name] c/o generalized joint ache, right ankle pain, neck pain, and Cameron [name] unloads trucks at [employer] Cameron [name] also endorses family history of RA in his father. Cameron [name] has a cyst to left 5th finger and unable to bend it. Cameron [name] denies pain and states that it's not bothering him. Cameron [name] states that his mood is good, denies suicidal nor homicidal ideation. Overall, Cameron [name] states that Cameron [name]'s doing well and offers no further complaint.  Review of Systems  Constitutional: Negative.   Respiratory: Negative.    Cardiovascular: Negative.   Musculoskeletal:  Positive for joint pain (Generalized joint pain).  Skin: Negative.   Neurological: Negative.   Psychiatric/Behavioral: Negative.        Objective:     BP 134/80   Pulse 91   Wt 170 lb 6.4 oz (77.3 kg)   SpO2 94%   BMI 26.69 kg/m  BP Readings from Last 3 Encounters:  11/03/22 134/80  03/12/22 113/85  12/08/21 128/83   Wt Readings from Last 3 Encounters:  11/03/22 170 lb 6.4 oz (77.3 kg)  12/08/21 174 lb 3.2 oz (79 kg)  11/03/21 169 lb 12.8 oz (77 kg)      Physical Exam HENT:     Head: Normocephalic.     Mouth/Throat:     Mouth: Mucous membranes are moist.  Eyes:     Pupils: Pupils are equal, round, and reactive to light.  Cardiovascular:     Rate and Rhythm: Normal rate and regular rhythm.     Pulses: Normal pulses.     Heart sounds: Normal heart sounds.  Pulmonary:     Effort: Pulmonary effort is normal.     Breath sounds: Normal breath sounds.  Musculoskeletal:      Comments: 5th finger to left hand not bending completely.  Skin:    General: Skin is warm.  Neurological:     General: No focal deficit present.     Mental Status: Cameron [name] is alert and oriented to person, place, and time. Mental status is at baseline.  Psychiatric:        Mood and Affect: Mood normal.        Behavior: Behavior normal.        Thought Content: Thought content normal.        Judgment: Judgment normal.      No results found for any visits on 11/03/22.  Last CBC Lab Results  Component Value Date   WBC 7.4 03/12/2022   HGB 14.6 03/12/2022   HCT 44.0 03/12/2022   MCV 94.8 03/12/2022   MCH 31.5 03/12/2022   RDW 14.0 03/12/2022   PLT 182 60/63/0160   Last metabolic panel Lab Results  Component Value Date   GLUCOSE 119 (H) 03/12/2022   NA 138 03/12/2022   K 3.5 03/12/2022   CL 104 03/12/2022   CO2 26 03/12/2022   BUN <5 (L) 03/12/2022   CREATININE 0.90 03/12/2022   GFRNONAA >60 03/12/2022   CALCIUM 8.2 (  L) 03/12/2022   PHOS 3.6 01/03/2019   PROT 7.9 12/08/2021   ALBUMIN 4.5 12/08/2021   LABGLOB 3.3 07/14/2021   AGRATIO 1.2 07/14/2021   BILITOT 2.0 (H) 12/08/2021   ALKPHOS 92 12/08/2021   AST 129 (H) 12/08/2021   ALT 31 12/08/2021   ANIONGAP 8 03/12/2022   Last lipids Lab Results  Component Value Date   CHOL 190 07/14/2021   HDL 48 07/14/2021   LDLCALC 122 (H) 07/14/2021   TRIG 109 07/14/2021   CHOLHDL 4.0 07/14/2021   Last hemoglobin A1c Lab Results  Component Value Date   HGBA1C 5.2 07/14/2021      The 10-year ASCVD risk score (Arnett DK, et al., 2019) is: 4.7%    Assessment & Plan:   1. Asthma due to environmental allergies - His breathing is stable, will continue current medication. - albuterol (PROVENTIL HFA) 108 (90 Base) MCG/ACT inhaler; Inhale 2 puffs into the lungs every 4 (four) hours as needed for wheezing or shortness of breath.  Dispense: 18 g; Refill: 3  2. Depression, recurrent ([place]) - Cameron [name] will continue current  medication, was advised to call the crisis help line with worsening symptoms. - sertraline (ZOLOFT) 50 MG tablet; Take 1 tablet (50 mg total) by mouth 2 (two) times daily.  Dispense: 60 tablet; Refill: 0 - traZODone (DESYREL) 100 MG tablet; Take 1 tablet (100 mg total) by mouth once nightly at bedtime.  Dispense: 30 tablet; Refill: 0  3. History of anxiety -Cameron [name] will continue current medication, was advised to call the crisis help line with worsening symptoms. - sertraline (ZOLOFT) 50 MG tablet; Take 1 tablet (50 mg total) by mouth 2 (two) times daily.  Dispense: 60 tablet; Refill: 0 - traZODone (DESYREL) 100 MG tablet; Take 1 tablet (100 mg total) by mouth once nightly at bedtime.  Dispense: 30 tablet; Refill: 0  4. Essential hypertension - His blood pressure is under control, Cameron [name] will continue current medication, DASH diet and exercise as tolerated. - amLODipine (NORVASC) 5 MG tablet; TAKE 1 TABLET BY MOUTH ONCE EVERY DAY.  Dispense: 30 tablet; Refill: 3  5. Arthralgia, unspecified joint -Will check RA and Cameron [name] will follow up with [facility] Dr Jefm Bryant - Rheumatoid Factor; Future  6. Health care maintenance - Routine labs will be checked - CBC w/Diff; Future - Lipid panel; Future - Comp Met (CMET); Future - HgB A1c; Future    Return in about 2 months (around 01/03/2023), or if symptoms worsen or fail to improve.    Dann Ventress Jerold Coombe, NP

## 2022-11-29 ENCOUNTER — Telehealth: Payer: Self-pay | Admitting: Licensed Clinical Social Worker

## 2022-11-29 ENCOUNTER — Ambulatory Visit: Payer: Self-pay | Admitting: Licensed Clinical Social Worker

## 2022-11-29 NOTE — Telephone Encounter (Signed)
Called the patient twice during today's scheduled appointment; no answer, left a voicemail with the clinic contact information so they may reschedule.   

## 2022-12-01 ENCOUNTER — Telehealth: Payer: Self-pay | Admitting: Licensed Clinical Social Worker

## 2022-12-01 ENCOUNTER — Ambulatory Visit: Payer: Self-pay | Admitting: Licensed Clinical Social Worker

## 2022-12-01 DIAGNOSIS — R936 Abnormal findings on diagnostic imaging of limbs: Secondary | ICD-10-CM | POA: Diagnosis not present

## 2022-12-01 DIAGNOSIS — S82042B Displaced comminuted fracture of left patella, initial encounter for open fracture type I or II: Secondary | ICD-10-CM | POA: Diagnosis not present

## 2022-12-01 DIAGNOSIS — I959 Hypotension, unspecified: Secondary | ICD-10-CM | POA: Diagnosis not present

## 2022-12-01 DIAGNOSIS — S7291XB Unspecified fracture of right femur, initial encounter for open fracture type I or II: Secondary | ICD-10-CM | POA: Diagnosis not present

## 2022-12-01 DIAGNOSIS — S52202A Unspecified fracture of shaft of left ulna, initial encounter for closed fracture: Secondary | ICD-10-CM | POA: Diagnosis not present

## 2022-12-01 DIAGNOSIS — S52602A Unspecified fracture of lower end of left ulna, initial encounter for closed fracture: Secondary | ICD-10-CM | POA: Diagnosis not present

## 2022-12-01 DIAGNOSIS — Z0389 Encounter for observation for other suspected diseases and conditions ruled out: Secondary | ICD-10-CM | POA: Diagnosis not present

## 2022-12-01 DIAGNOSIS — J9600 Acute respiratory failure, unspecified whether with hypoxia or hypercapnia: Secondary | ICD-10-CM | POA: Diagnosis not present

## 2022-12-01 DIAGNOSIS — S82144D Nondisplaced bicondylar fracture of right tibia, subsequent encounter for closed fracture with routine healing: Secondary | ICD-10-CM | POA: Diagnosis not present

## 2022-12-01 DIAGNOSIS — S32008A Other fracture of unspecified lumbar vertebra, initial encounter for closed fracture: Secondary | ICD-10-CM | POA: Diagnosis not present

## 2022-12-01 DIAGNOSIS — R571 Hypovolemic shock: Secondary | ICD-10-CM | POA: Diagnosis not present

## 2022-12-01 DIAGNOSIS — R7989 Other specified abnormal findings of blood chemistry: Secondary | ICD-10-CM | POA: Diagnosis not present

## 2022-12-01 DIAGNOSIS — E872 Acidosis, unspecified: Secondary | ICD-10-CM | POA: Diagnosis not present

## 2022-12-01 DIAGNOSIS — E8770 Fluid overload, unspecified: Secondary | ICD-10-CM | POA: Diagnosis not present

## 2022-12-01 DIAGNOSIS — E46 Unspecified protein-calorie malnutrition: Secondary | ICD-10-CM | POA: Diagnosis not present

## 2022-12-01 DIAGNOSIS — S82252B Displaced comminuted fracture of shaft of left tibia, initial encounter for open fracture type I or II: Secondary | ICD-10-CM | POA: Diagnosis not present

## 2022-12-01 DIAGNOSIS — S72351A Displaced comminuted fracture of shaft of right femur, initial encounter for closed fracture: Secondary | ICD-10-CM | POA: Diagnosis not present

## 2022-12-01 DIAGNOSIS — D62 Acute posthemorrhagic anemia: Secondary | ICD-10-CM | POA: Diagnosis not present

## 2022-12-01 DIAGNOSIS — S82144A Nondisplaced bicondylar fracture of right tibia, initial encounter for closed fracture: Secondary | ICD-10-CM | POA: Diagnosis not present

## 2022-12-01 DIAGNOSIS — S2242XA Multiple fractures of ribs, left side, initial encounter for closed fracture: Secondary | ICD-10-CM | POA: Diagnosis not present

## 2022-12-01 DIAGNOSIS — S52201A Unspecified fracture of shaft of right ulna, initial encounter for closed fracture: Secondary | ICD-10-CM | POA: Diagnosis not present

## 2022-12-01 DIAGNOSIS — Z9911 Dependence on respirator [ventilator] status: Secondary | ICD-10-CM | POA: Diagnosis not present

## 2022-12-01 DIAGNOSIS — K766 Portal hypertension: Secondary | ICD-10-CM | POA: Diagnosis not present

## 2022-12-01 DIAGNOSIS — R0989 Other specified symptoms and signs involving the circulatory and respiratory systems: Secondary | ICD-10-CM | POA: Diagnosis not present

## 2022-12-01 DIAGNOSIS — R739 Hyperglycemia, unspecified: Secondary | ICD-10-CM | POA: Diagnosis not present

## 2022-12-01 DIAGNOSIS — I8510 Secondary esophageal varices without bleeding: Secondary | ICD-10-CM | POA: Diagnosis not present

## 2022-12-01 DIAGNOSIS — R000 Tachycardia, unspecified: Secondary | ICD-10-CM | POA: Diagnosis not present

## 2022-12-01 DIAGNOSIS — S62315A Displaced fracture of base of fourth metacarpal bone, left hand, initial encounter for closed fracture: Secondary | ICD-10-CM | POA: Diagnosis not present

## 2022-12-01 DIAGNOSIS — S62317A Displaced fracture of base of fifth metacarpal bone. left hand, initial encounter for closed fracture: Secondary | ICD-10-CM | POA: Diagnosis not present

## 2022-12-01 DIAGNOSIS — S14109A Unspecified injury at unspecified level of cervical spinal cord, initial encounter: Secondary | ICD-10-CM | POA: Diagnosis not present

## 2022-12-01 DIAGNOSIS — K769 Liver disease, unspecified: Secondary | ICD-10-CM | POA: Diagnosis not present

## 2022-12-01 DIAGNOSIS — S199XXA Unspecified injury of neck, initial encounter: Secondary | ICD-10-CM | POA: Diagnosis not present

## 2022-12-01 DIAGNOSIS — S72352B Displaced comminuted fracture of shaft of left femur, initial encounter for open fracture type I or II: Secondary | ICD-10-CM | POA: Diagnosis not present

## 2022-12-01 DIAGNOSIS — L0390 Cellulitis, unspecified: Secondary | ICD-10-CM | POA: Diagnosis not present

## 2022-12-01 DIAGNOSIS — S82002A Unspecified fracture of left patella, initial encounter for closed fracture: Secondary | ICD-10-CM | POA: Diagnosis not present

## 2022-12-01 DIAGNOSIS — S52592B Other fractures of lower end of left radius, initial encounter for open fracture type I or II: Secondary | ICD-10-CM | POA: Diagnosis not present

## 2022-12-01 DIAGNOSIS — Z452 Encounter for adjustment and management of vascular access device: Secondary | ICD-10-CM | POA: Diagnosis not present

## 2022-12-01 DIAGNOSIS — I82611 Acute embolism and thrombosis of superficial veins of right upper extremity: Secondary | ICD-10-CM | POA: Diagnosis not present

## 2022-12-01 DIAGNOSIS — K7460 Unspecified cirrhosis of liver: Secondary | ICD-10-CM | POA: Diagnosis not present

## 2022-12-01 DIAGNOSIS — Z1152 Encounter for screening for COVID-19: Secondary | ICD-10-CM | POA: Diagnosis not present

## 2022-12-01 DIAGNOSIS — Z4682 Encounter for fitting and adjustment of non-vascular catheter: Secondary | ICD-10-CM | POA: Diagnosis not present

## 2022-12-01 DIAGNOSIS — S72352A Displaced comminuted fracture of shaft of left femur, initial encounter for closed fracture: Secondary | ICD-10-CM | POA: Diagnosis not present

## 2022-12-01 DIAGNOSIS — Y908 Blood alcohol level of 240 mg/100 ml or more: Secondary | ICD-10-CM | POA: Diagnosis not present

## 2022-12-01 DIAGNOSIS — L03311 Cellulitis of abdominal wall: Secondary | ICD-10-CM | POA: Diagnosis not present

## 2022-12-01 DIAGNOSIS — R509 Fever, unspecified: Secondary | ICD-10-CM | POA: Diagnosis not present

## 2022-12-01 DIAGNOSIS — R161 Splenomegaly, not elsewhere classified: Secondary | ICD-10-CM | POA: Diagnosis not present

## 2022-12-01 DIAGNOSIS — S82452A Displaced comminuted fracture of shaft of left fibula, initial encounter for closed fracture: Secondary | ICD-10-CM | POA: Diagnosis not present

## 2022-12-01 DIAGNOSIS — E871 Hypo-osmolality and hyponatremia: Secondary | ICD-10-CM | POA: Diagnosis not present

## 2022-12-01 DIAGNOSIS — S3991XA Unspecified injury of abdomen, initial encounter: Secondary | ICD-10-CM | POA: Diagnosis not present

## 2022-12-01 DIAGNOSIS — R188 Other ascites: Secondary | ICD-10-CM | POA: Diagnosis not present

## 2022-12-01 DIAGNOSIS — F32A Depression, unspecified: Secondary | ICD-10-CM | POA: Diagnosis not present

## 2022-12-01 DIAGNOSIS — S72491A Other fracture of lower end of right femur, initial encounter for closed fracture: Secondary | ICD-10-CM | POA: Diagnosis not present

## 2022-12-01 DIAGNOSIS — R601 Generalized edema: Secondary | ICD-10-CM | POA: Diagnosis not present

## 2022-12-01 DIAGNOSIS — K7469 Other cirrhosis of liver: Secondary | ICD-10-CM | POA: Diagnosis not present

## 2022-12-01 DIAGNOSIS — S52501A Unspecified fracture of the lower end of right radius, initial encounter for closed fracture: Secondary | ICD-10-CM | POA: Diagnosis not present

## 2022-12-01 DIAGNOSIS — S22048A Other fracture of fourth thoracic vertebra, initial encounter for closed fracture: Secondary | ICD-10-CM | POA: Diagnosis not present

## 2022-12-01 DIAGNOSIS — S82452B Displaced comminuted fracture of shaft of left fibula, initial encounter for open fracture type I or II: Secondary | ICD-10-CM | POA: Diagnosis not present

## 2022-12-01 DIAGNOSIS — S52601A Unspecified fracture of lower end of right ulna, initial encounter for closed fracture: Secondary | ICD-10-CM | POA: Diagnosis not present

## 2022-12-01 DIAGNOSIS — S72402B Unspecified fracture of lower end of left femur, initial encounter for open fracture type I or II: Secondary | ICD-10-CM | POA: Diagnosis not present

## 2022-12-01 DIAGNOSIS — S72401A Unspecified fracture of lower end of right femur, initial encounter for closed fracture: Secondary | ICD-10-CM | POA: Diagnosis not present

## 2022-12-01 DIAGNOSIS — S50812A Abrasion of left forearm, initial encounter: Secondary | ICD-10-CM | POA: Diagnosis not present

## 2022-12-01 DIAGNOSIS — S22040A Wedge compression fracture of fourth thoracic vertebra, initial encounter for closed fracture: Secondary | ICD-10-CM | POA: Diagnosis not present

## 2022-12-01 DIAGNOSIS — R609 Edema, unspecified: Secondary | ICD-10-CM | POA: Diagnosis not present

## 2022-12-01 DIAGNOSIS — S82042A Displaced comminuted fracture of left patella, initial encounter for closed fracture: Secondary | ICD-10-CM | POA: Diagnosis not present

## 2022-12-01 DIAGNOSIS — R140 Abdominal distension (gaseous): Secondary | ICD-10-CM | POA: Diagnosis not present

## 2022-12-01 DIAGNOSIS — S52501B Unspecified fracture of the lower end of right radius, initial encounter for open fracture type I or II: Secondary | ICD-10-CM | POA: Diagnosis not present

## 2022-12-01 DIAGNOSIS — S72492A Other fracture of lower end of left femur, initial encounter for closed fracture: Secondary | ICD-10-CM | POA: Diagnosis not present

## 2022-12-01 DIAGNOSIS — S065XAA Traumatic subdural hemorrhage with loss of consciousness status unknown, initial encounter: Secondary | ICD-10-CM | POA: Diagnosis not present

## 2022-12-01 DIAGNOSIS — S22049A Unspecified fracture of fourth thoracic vertebra, initial encounter for closed fracture: Secondary | ICD-10-CM | POA: Diagnosis not present

## 2022-12-01 DIAGNOSIS — S7292XB Unspecified fracture of left femur, initial encounter for open fracture type I or II: Secondary | ICD-10-CM | POA: Diagnosis not present

## 2022-12-01 DIAGNOSIS — R0689 Other abnormalities of breathing: Secondary | ICD-10-CM | POA: Diagnosis not present

## 2022-12-01 DIAGNOSIS — H5340 Unspecified visual field defects: Secondary | ICD-10-CM | POA: Diagnosis not present

## 2022-12-01 DIAGNOSIS — J90 Pleural effusion, not elsewhere classified: Secondary | ICD-10-CM | POA: Diagnosis not present

## 2022-12-01 DIAGNOSIS — R0600 Dyspnea, unspecified: Secondary | ICD-10-CM | POA: Diagnosis not present

## 2022-12-01 DIAGNOSIS — S299XXA Unspecified injury of thorax, initial encounter: Secondary | ICD-10-CM | POA: Diagnosis not present

## 2022-12-01 DIAGNOSIS — S52571B Other intraarticular fracture of lower end of right radius, initial encounter for open fracture type I or II: Secondary | ICD-10-CM | POA: Diagnosis not present

## 2022-12-01 DIAGNOSIS — S82102D Unspecified fracture of upper end of left tibia, subsequent encounter for closed fracture with routine healing: Secondary | ICD-10-CM | POA: Diagnosis not present

## 2022-12-01 DIAGNOSIS — J984 Other disorders of lung: Secondary | ICD-10-CM | POA: Diagnosis not present

## 2022-12-01 DIAGNOSIS — S82041D Displaced comminuted fracture of right patella, subsequent encounter for closed fracture with routine healing: Secondary | ICD-10-CM | POA: Diagnosis not present

## 2022-12-01 DIAGNOSIS — S0083XA Contusion of other part of head, initial encounter: Secondary | ICD-10-CM | POA: Diagnosis not present

## 2022-12-01 DIAGNOSIS — S52502A Unspecified fracture of the lower end of left radius, initial encounter for closed fracture: Secondary | ICD-10-CM | POA: Diagnosis not present

## 2022-12-01 DIAGNOSIS — S82452D Displaced comminuted fracture of shaft of left fibula, subsequent encounter for closed fracture with routine healing: Secondary | ICD-10-CM | POA: Diagnosis not present

## 2022-12-01 DIAGNOSIS — N5089 Other specified disorders of the male genital organs: Secondary | ICD-10-CM | POA: Diagnosis not present

## 2022-12-01 DIAGNOSIS — S3993XA Unspecified injury of pelvis, initial encounter: Secondary | ICD-10-CM | POA: Diagnosis not present

## 2022-12-01 DIAGNOSIS — R770 Abnormality of albumin: Secondary | ICD-10-CM | POA: Diagnosis not present

## 2022-12-01 DIAGNOSIS — R0902 Hypoxemia: Secondary | ICD-10-CM | POA: Diagnosis not present

## 2022-12-01 DIAGNOSIS — S52572A Other intraarticular fracture of lower end of left radius, initial encounter for closed fracture: Secondary | ICD-10-CM | POA: Diagnosis not present

## 2022-12-01 DIAGNOSIS — G8911 Acute pain due to trauma: Secondary | ICD-10-CM | POA: Diagnosis not present

## 2022-12-01 DIAGNOSIS — Z9889 Other specified postprocedural states: Secondary | ICD-10-CM | POA: Diagnosis not present

## 2022-12-01 DIAGNOSIS — S2249XA Multiple fractures of ribs, unspecified side, initial encounter for closed fracture: Secondary | ICD-10-CM | POA: Diagnosis not present

## 2022-12-01 DIAGNOSIS — J9811 Atelectasis: Secondary | ICD-10-CM | POA: Diagnosis not present

## 2022-12-01 DIAGNOSIS — M438X4 Other specified deforming dorsopathies, thoracic region: Secondary | ICD-10-CM | POA: Diagnosis not present

## 2022-12-01 DIAGNOSIS — R918 Other nonspecific abnormal finding of lung field: Secondary | ICD-10-CM | POA: Diagnosis not present

## 2022-12-01 DIAGNOSIS — S82252A Displaced comminuted fracture of shaft of left tibia, initial encounter for closed fracture: Secondary | ICD-10-CM | POA: Diagnosis not present

## 2022-12-01 DIAGNOSIS — R0602 Shortness of breath: Secondary | ICD-10-CM | POA: Diagnosis not present

## 2022-12-01 DIAGNOSIS — S82041B Displaced comminuted fracture of right patella, initial encounter for open fracture type I or II: Secondary | ICD-10-CM | POA: Diagnosis not present

## 2022-12-01 DIAGNOSIS — S72401B Unspecified fracture of lower end of right femur, initial encounter for open fracture type I or II: Secondary | ICD-10-CM | POA: Diagnosis not present

## 2022-12-01 DIAGNOSIS — S82041A Displaced comminuted fracture of right patella, initial encounter for closed fracture: Secondary | ICD-10-CM | POA: Diagnosis not present

## 2022-12-01 DIAGNOSIS — R4182 Altered mental status, unspecified: Secondary | ICD-10-CM | POA: Diagnosis not present

## 2022-12-01 DIAGNOSIS — M4312 Spondylolisthesis, cervical region: Secondary | ICD-10-CM | POA: Diagnosis not present

## 2022-12-01 DIAGNOSIS — R69 Illness, unspecified: Secondary | ICD-10-CM | POA: Diagnosis not present

## 2022-12-01 DIAGNOSIS — R932 Abnormal findings on diagnostic imaging of liver and biliary tract: Secondary | ICD-10-CM | POA: Diagnosis not present

## 2022-12-01 DIAGNOSIS — R578 Other shock: Secondary | ICD-10-CM | POA: Diagnosis not present

## 2022-12-01 DIAGNOSIS — R7401 Elevation of levels of liver transaminase levels: Secondary | ICD-10-CM | POA: Diagnosis not present

## 2022-12-01 DIAGNOSIS — S72351D Displaced comminuted fracture of shaft of right femur, subsequent encounter for closed fracture with routine healing: Secondary | ICD-10-CM | POA: Diagnosis not present

## 2022-12-01 DIAGNOSIS — S82202B Unspecified fracture of shaft of left tibia, initial encounter for open fracture type I or II: Secondary | ICD-10-CM | POA: Diagnosis not present

## 2022-12-01 DIAGNOSIS — I8500 Esophageal varices without bleeding: Secondary | ICD-10-CM | POA: Diagnosis not present

## 2022-12-01 DIAGNOSIS — S72352D Displaced comminuted fracture of shaft of left femur, subsequent encounter for closed fracture with routine healing: Secondary | ICD-10-CM | POA: Diagnosis not present

## 2022-12-01 NOTE — Telephone Encounter (Signed)
Called the patient twice during today's scheduled appointment; no answer, left a voicemail with the clinic contact information so they may reschedule.   

## 2022-12-27 ENCOUNTER — Ambulatory Visit: Payer: Self-pay | Admitting: Rheumatology

## 2023-01-03 ENCOUNTER — Ambulatory Visit: Payer: Self-pay | Admitting: Gerontology

## 2023-01-06 DIAGNOSIS — S62315A Displaced fracture of base of fourth metacarpal bone, left hand, initial encounter for closed fracture: Secondary | ICD-10-CM | POA: Diagnosis not present

## 2023-01-06 DIAGNOSIS — S22048A Other fracture of fourth thoracic vertebra, initial encounter for closed fracture: Secondary | ICD-10-CM | POA: Diagnosis not present

## 2023-01-06 DIAGNOSIS — S72351D Displaced comminuted fracture of shaft of right femur, subsequent encounter for closed fracture with routine healing: Secondary | ICD-10-CM | POA: Diagnosis not present

## 2023-01-06 DIAGNOSIS — S2242XA Multiple fractures of ribs, left side, initial encounter for closed fracture: Secondary | ICD-10-CM | POA: Diagnosis not present

## 2023-01-06 DIAGNOSIS — S62317A Displaced fracture of base of fifth metacarpal bone. left hand, initial encounter for closed fracture: Secondary | ICD-10-CM | POA: Diagnosis not present

## 2023-01-06 DIAGNOSIS — H5340 Unspecified visual field defects: Secondary | ICD-10-CM | POA: Diagnosis not present

## 2023-01-06 DIAGNOSIS — R5381 Other malaise: Secondary | ICD-10-CM | POA: Diagnosis not present

## 2023-01-10 DIAGNOSIS — R5381 Other malaise: Secondary | ICD-10-CM | POA: Diagnosis not present

## 2023-01-14 DIAGNOSIS — R9089 Other abnormal findings on diagnostic imaging of central nervous system: Secondary | ICD-10-CM | POA: Diagnosis not present

## 2023-01-14 DIAGNOSIS — D6832 Hemorrhagic disorder due to extrinsic circulating anticoagulants: Secondary | ICD-10-CM | POA: Diagnosis not present

## 2023-01-14 DIAGNOSIS — S72352D Displaced comminuted fracture of shaft of left femur, subsequent encounter for closed fracture with routine healing: Secondary | ICD-10-CM | POA: Diagnosis not present

## 2023-01-14 DIAGNOSIS — S065X0A Traumatic subdural hemorrhage without loss of consciousness, initial encounter: Secondary | ICD-10-CM | POA: Diagnosis not present

## 2023-01-14 DIAGNOSIS — S065XAD Traumatic subdural hemorrhage with loss of consciousness status unknown, subsequent encounter: Secondary | ICD-10-CM | POA: Diagnosis not present

## 2023-01-14 DIAGNOSIS — Z9889 Other specified postprocedural states: Secondary | ICD-10-CM | POA: Diagnosis not present

## 2023-01-14 DIAGNOSIS — S52351D Displaced comminuted fracture of shaft of radius, right arm, subsequent encounter for closed fracture with routine healing: Secondary | ICD-10-CM | POA: Diagnosis not present

## 2023-01-14 DIAGNOSIS — S62304D Unspecified fracture of fourth metacarpal bone, right hand, subsequent encounter for fracture with routine healing: Secondary | ICD-10-CM | POA: Diagnosis not present

## 2023-01-14 DIAGNOSIS — S52121D Displaced fracture of head of right radius, subsequent encounter for closed fracture with routine healing: Secondary | ICD-10-CM | POA: Diagnosis not present

## 2023-01-14 DIAGNOSIS — I6203 Nontraumatic chronic subdural hemorrhage: Secondary | ICD-10-CM | POA: Diagnosis not present

## 2023-01-14 DIAGNOSIS — Y838 Other surgical procedures as the cause of abnormal reaction of the patient, or of later complication, without mention of misadventure at the time of the procedure: Secondary | ICD-10-CM | POA: Diagnosis not present

## 2023-01-14 DIAGNOSIS — T45515A Adverse effect of anticoagulants, initial encounter: Secondary | ICD-10-CM | POA: Diagnosis not present

## 2023-01-14 DIAGNOSIS — G935 Compression of brain: Secondary | ICD-10-CM | POA: Diagnosis not present

## 2023-01-14 DIAGNOSIS — I6200 Nontraumatic subdural hemorrhage, unspecified: Secondary | ICD-10-CM | POA: Diagnosis not present

## 2023-01-14 DIAGNOSIS — I97821 Postprocedural cerebrovascular infarction during other surgery: Secondary | ICD-10-CM | POA: Diagnosis not present

## 2023-01-14 DIAGNOSIS — H53411 Scotoma involving central area, right eye: Secondary | ICD-10-CM | POA: Diagnosis not present

## 2023-01-14 DIAGNOSIS — F1010 Alcohol abuse, uncomplicated: Secondary | ICD-10-CM | POA: Diagnosis not present

## 2023-01-14 DIAGNOSIS — S72351D Displaced comminuted fracture of shaft of right femur, subsequent encounter for closed fracture with routine healing: Secondary | ICD-10-CM | POA: Diagnosis not present

## 2023-01-14 DIAGNOSIS — Z87891 Personal history of nicotine dependence: Secondary | ICD-10-CM | POA: Diagnosis not present

## 2023-01-14 DIAGNOSIS — M6281 Muscle weakness (generalized): Secondary | ICD-10-CM | POA: Diagnosis not present

## 2023-01-14 DIAGNOSIS — R52 Pain, unspecified: Secondary | ICD-10-CM | POA: Diagnosis not present

## 2023-01-14 DIAGNOSIS — S22049D Unspecified fracture of fourth thoracic vertebra, subsequent encounter for fracture with routine healing: Secondary | ICD-10-CM | POA: Diagnosis not present

## 2023-01-14 DIAGNOSIS — S82252D Displaced comminuted fracture of shaft of left tibia, subsequent encounter for closed fracture with routine healing: Secondary | ICD-10-CM | POA: Diagnosis not present

## 2023-01-14 DIAGNOSIS — H53461 Homonymous bilateral field defects, right side: Secondary | ICD-10-CM | POA: Diagnosis not present

## 2023-01-14 DIAGNOSIS — S52251D Displaced comminuted fracture of shaft of ulna, right arm, subsequent encounter for closed fracture with routine healing: Secondary | ICD-10-CM | POA: Diagnosis not present

## 2023-01-14 DIAGNOSIS — R1110 Vomiting, unspecified: Secondary | ICD-10-CM | POA: Diagnosis not present

## 2023-01-14 DIAGNOSIS — Z5941 Food insecurity: Secondary | ICD-10-CM | POA: Diagnosis not present

## 2023-01-14 DIAGNOSIS — I6201 Nontraumatic acute subdural hemorrhage: Secondary | ICD-10-CM | POA: Diagnosis not present

## 2023-01-14 DIAGNOSIS — Z7901 Long term (current) use of anticoagulants: Secondary | ICD-10-CM | POA: Diagnosis not present

## 2023-01-14 DIAGNOSIS — R519 Headache, unspecified: Secondary | ICD-10-CM | POA: Diagnosis not present

## 2023-01-14 DIAGNOSIS — S2242XD Multiple fractures of ribs, left side, subsequent encounter for fracture with routine healing: Secondary | ICD-10-CM | POA: Diagnosis not present

## 2023-01-14 DIAGNOSIS — S52611D Displaced fracture of right ulna styloid process, subsequent encounter for closed fracture with routine healing: Secondary | ICD-10-CM | POA: Diagnosis not present

## 2023-01-14 DIAGNOSIS — I63532 Cerebral infarction due to unspecified occlusion or stenosis of left posterior cerebral artery: Secondary | ICD-10-CM | POA: Diagnosis not present

## 2023-01-14 DIAGNOSIS — Z5989 Other problems related to housing and economic circumstances: Secondary | ICD-10-CM | POA: Diagnosis not present

## 2023-01-14 DIAGNOSIS — H53451 Other localized visual field defect, right eye: Secondary | ICD-10-CM | POA: Diagnosis not present

## 2023-01-14 DIAGNOSIS — I6389 Other cerebral infarction: Secondary | ICD-10-CM | POA: Diagnosis not present

## 2023-01-14 DIAGNOSIS — S065XAA Traumatic subdural hemorrhage with loss of consciousness status unknown, initial encounter: Secondary | ICD-10-CM | POA: Diagnosis not present

## 2023-01-14 DIAGNOSIS — Z20822 Contact with and (suspected) exposure to covid-19: Secondary | ICD-10-CM | POA: Diagnosis not present

## 2023-01-14 DIAGNOSIS — G9389 Other specified disorders of brain: Secondary | ICD-10-CM | POA: Diagnosis not present

## 2023-01-14 DIAGNOSIS — S82402D Unspecified fracture of shaft of left fibula, subsequent encounter for closed fracture with routine healing: Secondary | ICD-10-CM | POA: Diagnosis not present

## 2023-01-14 DIAGNOSIS — G44209 Tension-type headache, unspecified, not intractable: Secondary | ICD-10-CM | POA: Diagnosis not present

## 2023-01-14 DIAGNOSIS — Z79899 Other long term (current) drug therapy: Secondary | ICD-10-CM | POA: Diagnosis not present

## 2023-01-14 DIAGNOSIS — I63413 Cerebral infarction due to embolism of bilateral middle cerebral arteries: Secondary | ICD-10-CM | POA: Diagnosis not present

## 2023-01-18 ENCOUNTER — Telehealth: Payer: Self-pay

## 2023-01-18 NOTE — Telephone Encounter (Signed)
Pt missed visit with Heather on 12/01/2022. Sent 1st no show letter to pt.

## 2023-01-20 DIAGNOSIS — S82252D Displaced comminuted fracture of shaft of left tibia, subsequent encounter for closed fracture with routine healing: Secondary | ICD-10-CM | POA: Diagnosis not present

## 2023-01-20 DIAGNOSIS — S22049D Unspecified fracture of fourth thoracic vertebra, subsequent encounter for fracture with routine healing: Secondary | ICD-10-CM | POA: Diagnosis not present

## 2023-01-20 DIAGNOSIS — R5381 Other malaise: Secondary | ICD-10-CM | POA: Diagnosis not present

## 2023-01-20 DIAGNOSIS — S72351D Displaced comminuted fracture of shaft of right femur, subsequent encounter for closed fracture with routine healing: Secondary | ICD-10-CM | POA: Diagnosis not present

## 2023-01-20 DIAGNOSIS — S2242XD Multiple fractures of ribs, left side, subsequent encounter for fracture with routine healing: Secondary | ICD-10-CM | POA: Diagnosis not present

## 2023-01-20 DIAGNOSIS — I6200 Nontraumatic subdural hemorrhage, unspecified: Secondary | ICD-10-CM | POA: Diagnosis not present

## 2023-01-20 DIAGNOSIS — S72402D Unspecified fracture of lower end of left femur, subsequent encounter for closed fracture with routine healing: Secondary | ICD-10-CM | POA: Diagnosis not present

## 2023-01-20 DIAGNOSIS — S52121D Displaced fracture of head of right radius, subsequent encounter for closed fracture with routine healing: Secondary | ICD-10-CM | POA: Diagnosis not present

## 2023-01-20 DIAGNOSIS — S52501D Unspecified fracture of the lower end of right radius, subsequent encounter for closed fracture with routine healing: Secondary | ICD-10-CM | POA: Diagnosis not present

## 2023-01-20 DIAGNOSIS — M6281 Muscle weakness (generalized): Secondary | ICD-10-CM | POA: Diagnosis not present

## 2023-01-20 DIAGNOSIS — Z09 Encounter for follow-up examination after completed treatment for conditions other than malignant neoplasm: Secondary | ICD-10-CM | POA: Diagnosis not present

## 2023-01-20 DIAGNOSIS — S52611D Displaced fracture of right ulna styloid process, subsequent encounter for closed fracture with routine healing: Secondary | ICD-10-CM | POA: Diagnosis not present

## 2023-01-20 DIAGNOSIS — S62304D Unspecified fracture of fourth metacarpal bone, right hand, subsequent encounter for fracture with routine healing: Secondary | ICD-10-CM | POA: Diagnosis not present

## 2023-01-20 DIAGNOSIS — S82002E Unspecified fracture of left patella, subsequent encounter for open fracture type I or II with routine healing: Secondary | ICD-10-CM | POA: Diagnosis not present

## 2023-01-20 DIAGNOSIS — S62335D Displaced fracture of neck of fourth metacarpal bone, left hand, subsequent encounter for fracture with routine healing: Secondary | ICD-10-CM | POA: Diagnosis not present

## 2023-01-20 DIAGNOSIS — S065XAD Traumatic subdural hemorrhage with loss of consciousness status unknown, subsequent encounter: Secondary | ICD-10-CM | POA: Diagnosis not present

## 2023-01-20 DIAGNOSIS — S62337D Displaced fracture of neck of fifth metacarpal bone, left hand, subsequent encounter for fracture with routine healing: Secondary | ICD-10-CM | POA: Diagnosis not present

## 2023-01-20 DIAGNOSIS — S52502D Unspecified fracture of the lower end of left radius, subsequent encounter for closed fracture with routine healing: Secondary | ICD-10-CM | POA: Diagnosis not present

## 2023-01-20 DIAGNOSIS — S82001E Unspecified fracture of right patella, subsequent encounter for open fracture type I or II with routine healing: Secondary | ICD-10-CM | POA: Diagnosis not present

## 2023-01-20 DIAGNOSIS — S82402D Unspecified fracture of shaft of left fibula, subsequent encounter for closed fracture with routine healing: Secondary | ICD-10-CM | POA: Diagnosis not present

## 2023-01-20 DIAGNOSIS — S72352D Displaced comminuted fracture of shaft of left femur, subsequent encounter for closed fracture with routine healing: Secondary | ICD-10-CM | POA: Diagnosis not present

## 2023-01-23 DIAGNOSIS — R5381 Other malaise: Secondary | ICD-10-CM | POA: Diagnosis not present

## 2023-01-26 DIAGNOSIS — Z09 Encounter for follow-up examination after completed treatment for conditions other than malignant neoplasm: Secondary | ICD-10-CM | POA: Diagnosis not present

## 2023-01-26 DIAGNOSIS — R5381 Other malaise: Secondary | ICD-10-CM | POA: Diagnosis not present

## 2023-02-03 DIAGNOSIS — R5381 Other malaise: Secondary | ICD-10-CM | POA: Diagnosis not present

## 2023-02-08 DIAGNOSIS — I6200 Nontraumatic subdural hemorrhage, unspecified: Secondary | ICD-10-CM | POA: Diagnosis not present

## 2023-02-09 DIAGNOSIS — S72351D Displaced comminuted fracture of shaft of right femur, subsequent encounter for closed fracture with routine healing: Secondary | ICD-10-CM | POA: Diagnosis not present

## 2023-02-09 DIAGNOSIS — S62337D Displaced fracture of neck of fifth metacarpal bone, left hand, subsequent encounter for fracture with routine healing: Secondary | ICD-10-CM | POA: Diagnosis not present

## 2023-02-09 DIAGNOSIS — S72352D Displaced comminuted fracture of shaft of left femur, subsequent encounter for closed fracture with routine healing: Secondary | ICD-10-CM | POA: Diagnosis not present

## 2023-02-09 DIAGNOSIS — S62335D Displaced fracture of neck of fourth metacarpal bone, left hand, subsequent encounter for fracture with routine healing: Secondary | ICD-10-CM | POA: Diagnosis not present

## 2023-02-09 DIAGNOSIS — S82252D Displaced comminuted fracture of shaft of left tibia, subsequent encounter for closed fracture with routine healing: Secondary | ICD-10-CM | POA: Diagnosis not present

## 2023-02-09 DIAGNOSIS — S52501D Unspecified fracture of the lower end of right radius, subsequent encounter for closed fracture with routine healing: Secondary | ICD-10-CM | POA: Diagnosis not present

## 2023-02-09 DIAGNOSIS — S52502D Unspecified fracture of the lower end of left radius, subsequent encounter for closed fracture with routine healing: Secondary | ICD-10-CM | POA: Diagnosis not present

## 2023-02-09 DIAGNOSIS — S82001E Unspecified fracture of right patella, subsequent encounter for open fracture type I or II with routine healing: Secondary | ICD-10-CM | POA: Diagnosis not present

## 2023-02-09 DIAGNOSIS — S72402D Unspecified fracture of lower end of left femur, subsequent encounter for closed fracture with routine healing: Secondary | ICD-10-CM | POA: Diagnosis not present

## 2023-02-09 DIAGNOSIS — S82402D Unspecified fracture of shaft of left fibula, subsequent encounter for closed fracture with routine healing: Secondary | ICD-10-CM | POA: Diagnosis not present

## 2023-02-09 DIAGNOSIS — S82002E Unspecified fracture of left patella, subsequent encounter for open fracture type I or II with routine healing: Secondary | ICD-10-CM | POA: Diagnosis not present

## 2023-02-10 DIAGNOSIS — R5381 Other malaise: Secondary | ICD-10-CM | POA: Diagnosis not present

## 2023-02-14 DIAGNOSIS — R5381 Other malaise: Secondary | ICD-10-CM | POA: Diagnosis not present

## 2023-02-17 DIAGNOSIS — S72401K Unspecified fracture of lower end of right femur, subsequent encounter for closed fracture with nonunion: Secondary | ICD-10-CM | POA: Diagnosis not present

## 2023-02-27 DIAGNOSIS — F1010 Alcohol abuse, uncomplicated: Secondary | ICD-10-CM | POA: Diagnosis not present

## 2023-02-27 DIAGNOSIS — R7989 Other specified abnormal findings of blood chemistry: Secondary | ICD-10-CM | POA: Diagnosis not present

## 2023-02-27 DIAGNOSIS — D649 Anemia, unspecified: Secondary | ICD-10-CM | POA: Diagnosis not present

## 2023-02-27 DIAGNOSIS — R600 Localized edema: Secondary | ICD-10-CM | POA: Diagnosis not present

## 2023-02-27 DIAGNOSIS — M2550 Pain in unspecified joint: Secondary | ICD-10-CM | POA: Diagnosis not present

## 2023-02-27 DIAGNOSIS — S2232XA Fracture of one rib, left side, initial encounter for closed fracture: Secondary | ICD-10-CM | POA: Diagnosis not present

## 2023-02-27 DIAGNOSIS — S299XXA Unspecified injury of thorax, initial encounter: Secondary | ICD-10-CM | POA: Diagnosis not present

## 2023-02-27 DIAGNOSIS — F1911 Other psychoactive substance abuse, in remission: Secondary | ICD-10-CM | POA: Diagnosis not present

## 2023-02-27 DIAGNOSIS — F325 Major depressive disorder, single episode, in full remission: Secondary | ICD-10-CM | POA: Diagnosis not present

## 2023-02-28 DIAGNOSIS — F1090 Alcohol use, unspecified, uncomplicated: Secondary | ICD-10-CM | POA: Diagnosis not present

## 2023-02-28 DIAGNOSIS — R188 Other ascites: Secondary | ICD-10-CM | POA: Insufficient documentation

## 2023-02-28 DIAGNOSIS — K7469 Other cirrhosis of liver: Secondary | ICD-10-CM | POA: Diagnosis not present

## 2023-03-02 ENCOUNTER — Other Ambulatory Visit: Payer: Self-pay | Admitting: Gastroenterology

## 2023-03-02 DIAGNOSIS — K7469 Other cirrhosis of liver: Secondary | ICD-10-CM

## 2023-03-13 ENCOUNTER — Ambulatory Visit: Payer: Self-pay

## 2023-03-13 DIAGNOSIS — S72421F Displaced fracture of lateral condyle of right femur, subsequent encounter for open fracture type IIIA, IIIB, or IIIC with routine healing: Secondary | ICD-10-CM | POA: Diagnosis not present

## 2023-03-13 DIAGNOSIS — S82009E Unspecified fracture of unspecified patella, subsequent encounter for open fracture type I or II with routine healing: Secondary | ICD-10-CM | POA: Diagnosis not present

## 2023-03-13 DIAGNOSIS — S82202F Unspecified fracture of shaft of left tibia, subsequent encounter for open fracture type IIIA, IIIB, or IIIC with routine healing: Secondary | ICD-10-CM | POA: Diagnosis not present

## 2023-03-13 DIAGNOSIS — S62333D Displaced fracture of neck of third metacarpal bone, left hand, subsequent encounter for fracture with routine healing: Secondary | ICD-10-CM | POA: Diagnosis not present

## 2023-03-13 DIAGNOSIS — S72431F Displaced fracture of medial condyle of right femur, subsequent encounter for open fracture type IIIA, IIIB, or IIIC with routine healing: Secondary | ICD-10-CM | POA: Diagnosis not present

## 2023-03-13 DIAGNOSIS — X58XXXD Exposure to other specified factors, subsequent encounter: Secondary | ICD-10-CM | POA: Diagnosis not present

## 2023-03-17 ENCOUNTER — Ambulatory Visit: Admission: RE | Admit: 2023-03-17 | Payer: 59 | Source: Ambulatory Visit

## 2023-03-17 DIAGNOSIS — F1910 Other psychoactive substance abuse, uncomplicated: Secondary | ICD-10-CM | POA: Diagnosis not present

## 2023-03-17 DIAGNOSIS — D649 Anemia, unspecified: Secondary | ICD-10-CM | POA: Diagnosis not present

## 2023-03-17 DIAGNOSIS — K7030 Alcoholic cirrhosis of liver without ascites: Secondary | ICD-10-CM | POA: Diagnosis not present

## 2023-03-17 DIAGNOSIS — F1010 Alcohol abuse, uncomplicated: Secondary | ICD-10-CM | POA: Diagnosis not present

## 2023-03-17 DIAGNOSIS — S065XAA Traumatic subdural hemorrhage with loss of consciousness status unknown, initial encounter: Secondary | ICD-10-CM | POA: Diagnosis not present

## 2023-03-17 DIAGNOSIS — F325 Major depressive disorder, single episode, in full remission: Secondary | ICD-10-CM | POA: Diagnosis not present

## 2023-03-17 DIAGNOSIS — S2232XA Fracture of one rib, left side, initial encounter for closed fracture: Secondary | ICD-10-CM | POA: Diagnosis not present

## 2023-03-29 DIAGNOSIS — F1010 Alcohol abuse, uncomplicated: Secondary | ICD-10-CM | POA: Diagnosis not present

## 2023-03-29 DIAGNOSIS — S2232XA Fracture of one rib, left side, initial encounter for closed fracture: Secondary | ICD-10-CM | POA: Diagnosis not present

## 2023-03-29 DIAGNOSIS — J45909 Unspecified asthma, uncomplicated: Secondary | ICD-10-CM | POA: Diagnosis not present

## 2023-03-29 DIAGNOSIS — F325 Major depressive disorder, single episode, in full remission: Secondary | ICD-10-CM | POA: Diagnosis not present

## 2023-03-29 DIAGNOSIS — D649 Anemia, unspecified: Secondary | ICD-10-CM | POA: Diagnosis not present

## 2023-03-29 DIAGNOSIS — N486 Induration penis plastica: Secondary | ICD-10-CM | POA: Diagnosis not present

## 2023-03-29 DIAGNOSIS — R600 Localized edema: Secondary | ICD-10-CM | POA: Diagnosis not present

## 2023-03-29 DIAGNOSIS — M2550 Pain in unspecified joint: Secondary | ICD-10-CM | POA: Diagnosis not present

## 2023-03-29 DIAGNOSIS — S065XAA Traumatic subdural hemorrhage with loss of consciousness status unknown, initial encounter: Secondary | ICD-10-CM | POA: Diagnosis not present

## 2023-03-29 DIAGNOSIS — F1910 Other psychoactive substance abuse, uncomplicated: Secondary | ICD-10-CM | POA: Diagnosis not present

## 2023-03-29 DIAGNOSIS — K7030 Alcoholic cirrhosis of liver without ascites: Secondary | ICD-10-CM | POA: Diagnosis not present

## 2023-04-10 ENCOUNTER — Other Ambulatory Visit: Payer: Self-pay

## 2023-04-13 DIAGNOSIS — E785 Hyperlipidemia, unspecified: Secondary | ICD-10-CM | POA: Diagnosis not present

## 2023-04-13 DIAGNOSIS — M62838 Other muscle spasm: Secondary | ICD-10-CM | POA: Diagnosis not present

## 2023-04-13 DIAGNOSIS — Z5948 Other specified lack of adequate food: Secondary | ICD-10-CM | POA: Diagnosis not present

## 2023-04-13 DIAGNOSIS — G4700 Insomnia, unspecified: Secondary | ICD-10-CM | POA: Diagnosis not present

## 2023-04-13 DIAGNOSIS — F321 Major depressive disorder, single episode, moderate: Secondary | ICD-10-CM | POA: Diagnosis not present

## 2023-04-13 DIAGNOSIS — Z5982 Transportation insecurity: Secondary | ICD-10-CM | POA: Diagnosis not present

## 2023-04-13 DIAGNOSIS — K7030 Alcoholic cirrhosis of liver without ascites: Secondary | ICD-10-CM | POA: Diagnosis not present

## 2023-04-13 DIAGNOSIS — K766 Portal hypertension: Secondary | ICD-10-CM | POA: Diagnosis not present

## 2023-04-13 DIAGNOSIS — F1021 Alcohol dependence, in remission: Secondary | ICD-10-CM | POA: Diagnosis not present

## 2023-04-13 DIAGNOSIS — I10 Essential (primary) hypertension: Secondary | ICD-10-CM | POA: Diagnosis not present

## 2023-04-13 DIAGNOSIS — Z833 Family history of diabetes mellitus: Secondary | ICD-10-CM | POA: Diagnosis not present

## 2023-04-13 DIAGNOSIS — G621 Alcoholic polyneuropathy: Secondary | ICD-10-CM | POA: Diagnosis not present

## 2023-04-27 DIAGNOSIS — S82102A Unspecified fracture of upper end of left tibia, initial encounter for closed fracture: Secondary | ICD-10-CM | POA: Diagnosis not present

## 2023-04-27 DIAGNOSIS — S52502D Unspecified fracture of the lower end of left radius, subsequent encounter for closed fracture with routine healing: Secondary | ICD-10-CM | POA: Diagnosis not present

## 2023-04-27 DIAGNOSIS — S72499A Other fracture of lower end of unspecified femur, initial encounter for closed fracture: Secondary | ICD-10-CM | POA: Diagnosis not present

## 2023-04-27 DIAGNOSIS — S52509B Unspecified fracture of the lower end of unspecified radius, initial encounter for open fracture type I or II: Secondary | ICD-10-CM | POA: Diagnosis not present

## 2023-04-27 DIAGNOSIS — S82001A Unspecified fracture of right patella, initial encounter for closed fracture: Secondary | ICD-10-CM | POA: Diagnosis not present

## 2023-04-27 DIAGNOSIS — S52601D Unspecified fracture of lower end of right ulna, subsequent encounter for closed fracture with routine healing: Secondary | ICD-10-CM | POA: Diagnosis not present

## 2023-04-27 DIAGNOSIS — S62333A Displaced fracture of neck of third metacarpal bone, left hand, initial encounter for closed fracture: Secondary | ICD-10-CM | POA: Diagnosis not present

## 2023-04-27 DIAGNOSIS — S62315D Displaced fracture of base of fourth metacarpal bone, left hand, subsequent encounter for fracture with routine healing: Secondary | ICD-10-CM | POA: Diagnosis not present

## 2023-04-27 DIAGNOSIS — S82009B Unspecified fracture of unspecified patella, initial encounter for open fracture type I or II: Secondary | ICD-10-CM | POA: Diagnosis not present

## 2023-04-27 DIAGNOSIS — S52501D Unspecified fracture of the lower end of right radius, subsequent encounter for closed fracture with routine healing: Secondary | ICD-10-CM | POA: Diagnosis not present

## 2023-04-27 DIAGNOSIS — S62317D Displaced fracture of base of fifth metacarpal bone. left hand, subsequent encounter for fracture with routine healing: Secondary | ICD-10-CM | POA: Diagnosis not present

## 2023-04-27 DIAGNOSIS — S72402D Unspecified fracture of lower end of left femur, subsequent encounter for closed fracture with routine healing: Secondary | ICD-10-CM | POA: Diagnosis not present

## 2023-04-27 DIAGNOSIS — S82202F Unspecified fracture of shaft of left tibia, subsequent encounter for open fracture type IIIA, IIIB, or IIIC with routine healing: Secondary | ICD-10-CM | POA: Diagnosis not present

## 2023-04-27 DIAGNOSIS — S82832A Other fracture of upper and lower end of left fibula, initial encounter for closed fracture: Secondary | ICD-10-CM | POA: Diagnosis not present

## 2023-04-27 DIAGNOSIS — S82402D Unspecified fracture of shaft of left fibula, subsequent encounter for closed fracture with routine healing: Secondary | ICD-10-CM | POA: Diagnosis not present

## 2023-04-27 DIAGNOSIS — S72401D Unspecified fracture of lower end of right femur, subsequent encounter for closed fracture with routine healing: Secondary | ICD-10-CM | POA: Diagnosis not present

## 2023-04-27 DIAGNOSIS — X58XXXA Exposure to other specified factors, initial encounter: Secondary | ICD-10-CM | POA: Diagnosis not present

## 2023-05-03 DIAGNOSIS — S8261XD Displaced fracture of lateral malleolus of right fibula, subsequent encounter for closed fracture with routine healing: Secondary | ICD-10-CM | POA: Diagnosis not present

## 2023-05-03 DIAGNOSIS — F1010 Alcohol abuse, uncomplicated: Secondary | ICD-10-CM | POA: Diagnosis not present

## 2023-05-03 DIAGNOSIS — F325 Major depressive disorder, single episode, in full remission: Secondary | ICD-10-CM | POA: Diagnosis not present

## 2023-05-03 DIAGNOSIS — F1910 Other psychoactive substance abuse, uncomplicated: Secondary | ICD-10-CM | POA: Diagnosis not present

## 2023-05-03 DIAGNOSIS — S065XAD Traumatic subdural hemorrhage with loss of consciousness status unknown, subsequent encounter: Secondary | ICD-10-CM | POA: Diagnosis not present

## 2023-05-03 DIAGNOSIS — M2550 Pain in unspecified joint: Secondary | ICD-10-CM | POA: Diagnosis not present

## 2023-05-03 DIAGNOSIS — K7030 Alcoholic cirrhosis of liver without ascites: Secondary | ICD-10-CM | POA: Diagnosis not present

## 2023-05-07 DIAGNOSIS — S99911A Unspecified injury of right ankle, initial encounter: Secondary | ICD-10-CM | POA: Diagnosis not present

## 2023-05-07 DIAGNOSIS — S8266XS Nondisplaced fracture of lateral malleolus of unspecified fibula, sequela: Secondary | ICD-10-CM | POA: Diagnosis not present

## 2023-05-09 ENCOUNTER — Ambulatory Visit: Payer: Medicaid Other | Admitting: Urology

## 2023-05-09 DIAGNOSIS — H524 Presbyopia: Secondary | ICD-10-CM | POA: Diagnosis not present

## 2023-05-26 ENCOUNTER — Ambulatory Visit (INDEPENDENT_AMBULATORY_CARE_PROVIDER_SITE_OTHER): Payer: Medicaid Other | Admitting: Urology

## 2023-05-26 VITALS — BP 118/82 | HR 85 | Ht 67.0 in | Wt 170.0 lb

## 2023-05-26 DIAGNOSIS — R350 Frequency of micturition: Secondary | ICD-10-CM | POA: Diagnosis not present

## 2023-05-26 DIAGNOSIS — Z7689 Persons encountering health services in other specified circumstances: Secondary | ICD-10-CM

## 2023-05-26 DIAGNOSIS — N4889 Other specified disorders of penis: Secondary | ICD-10-CM | POA: Diagnosis not present

## 2023-05-26 DIAGNOSIS — N486 Induration penis plastica: Secondary | ICD-10-CM

## 2023-05-26 NOTE — Patient Instructions (Signed)
Peyronie's Disease You will learn what this condition is and what treatment options are available. To view the content, go to this web address: [email or web address]  This video will expire on: 01/18/2025. If you need access to this video following this date, please reach out to the healthcare provider who assigned it to you. This information is not intended to replace advice given to you by your health care provider. Make sure you discuss any questions you have with your health care provider. Elsevier Patient Education  2024 [employer].
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
## 2023-06-08 DIAGNOSIS — X58XXXD Exposure to other specified factors, subsequent encounter: Secondary | ICD-10-CM | POA: Diagnosis not present

## 2023-06-08 DIAGNOSIS — S72421F Displaced fracture of lateral condyle of right femur, subsequent encounter for open fracture type IIIA, IIIB, or IIIC with routine healing: Secondary | ICD-10-CM | POA: Diagnosis not present

## 2023-06-08 DIAGNOSIS — S72431E Displaced fracture of medial condyle of right femur, subsequent encounter for open fracture type I or II with routine healing: Secondary | ICD-10-CM | POA: Diagnosis not present

## 2023-06-08 DIAGNOSIS — Z946 Bone transplant status: Secondary | ICD-10-CM | POA: Diagnosis not present

## 2023-06-08 DIAGNOSIS — S72401E Unspecified fracture of lower end of right femur, subsequent encounter for open fracture type I or II with routine healing: Secondary | ICD-10-CM | POA: Diagnosis not present

## 2023-06-08 DIAGNOSIS — S72351D Displaced comminuted fracture of shaft of right femur, subsequent encounter for closed fracture with routine healing: Secondary | ICD-10-CM | POA: Diagnosis not present

## 2023-06-08 DIAGNOSIS — S72421E Displaced fracture of lateral condyle of right femur, subsequent encounter for open fracture type I or II with routine healing: Secondary | ICD-10-CM | POA: Diagnosis not present

## 2023-06-08 DIAGNOSIS — S7291XF Unspecified fracture of right femur, subsequent encounter for open fracture type IIIA, IIIB, or IIIC with routine healing: Secondary | ICD-10-CM | POA: Diagnosis not present

## 2023-06-08 DIAGNOSIS — S72431F Displaced fracture of medial condyle of right femur, subsequent encounter for open fracture type IIIA, IIIB, or IIIC with routine healing: Secondary | ICD-10-CM | POA: Diagnosis not present

## 2023-06-26 ENCOUNTER — Telehealth: Payer: Self-pay | Admitting: Urology

## 2023-06-26 NOTE — Telephone Encounter (Signed)
Patient called office today regarding his insurance coverage. He stated that he also has [employer] [facility], which is in network with office. He said that he had been referred to another Urologist, but would like to continue seeing our office for treatment. Will his insurance approve Xiaflex? Please advise patient. Patient gave insurance information ove the phone and I entered in patient chart. He is supposed to be emailing copy of insurance card. I will scan into chart when received.

## 2023-06-27 NOTE — Telephone Encounter (Addendum)
Spoke with Dr Apolinar Junes and was advised that patient did not have a physical exam for this as noted in the last OV note. Per Dr Apolinar Junes patient needs to see one of the PAs for the physical exam before proceeding with the Xiaflex.  Patient advised.

## 2023-06-27 NOTE — Telephone Encounter (Signed)
PT called back and will try to get a ride over to office to sign form.

## 2023-06-27 NOTE — Telephone Encounter (Signed)
Called patient to discuss but voicemail is not set up. Patient needs to come by the office to sign form to submit insurance information for benefits. Not sure what insurance will cover until we submit and receive fax back.
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
## 2023-07-04 ENCOUNTER — Ambulatory Visit: Payer: 59 | Admitting: Urology
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
## 2023-07-11 ENCOUNTER — Encounter: Payer: Self-pay | Admitting: Urology

## 2023-07-11 ENCOUNTER — Ambulatory Visit (INDEPENDENT_AMBULATORY_CARE_PROVIDER_SITE_OTHER): Payer: 59 | Admitting: Urology

## 2023-07-11 VITALS — BP 129/81 | HR 88 | Ht 67.0 in | Wt 170.0 lb

## 2023-07-11 DIAGNOSIS — N486 Induration penis plastica: Secondary | ICD-10-CM | POA: Diagnosis not present

## 2023-07-11 DIAGNOSIS — R350 Frequency of micturition: Secondary | ICD-10-CM

## 2023-07-11 LAB — URINALYSIS, COMPLETE
Bilirubin, UA: NEGATIVE
Glucose, UA: NEGATIVE
Leukocytes,UA: NEGATIVE
Nitrite, UA: NEGATIVE
Protein,UA: NEGATIVE
RBC, UA: NEGATIVE
Specific Gravity, UA: 1.015 (ref 1.005–1.030)
Urobilinogen, Ur: 4 mg/dL — ABNORMAL HIGH (ref 0.2–1.0)
pH, UA: 5.5 (ref 5.0–7.5)

## 2023-07-11 LAB — MICROSCOPIC EXAMINATION

## 2023-07-11 MED ORDER — PENTOXIFYLLINE ER 400 MG PO TBCR
400.0000 mg | EXTENDED_RELEASE_TABLET | Freq: Three times a day (TID) | ORAL | 1 refills | Status: DC
Start: 2023-07-11 — End: 2024-05-23

## 2023-08-07 DIAGNOSIS — Z125 Encounter for screening for malignant neoplasm of prostate: Secondary | ICD-10-CM | POA: Diagnosis not present

## 2023-08-07 DIAGNOSIS — F325 Major depressive disorder, single episode, in full remission: Secondary | ICD-10-CM | POA: Diagnosis not present

## 2023-08-07 DIAGNOSIS — K7030 Alcoholic cirrhosis of liver without ascites: Secondary | ICD-10-CM | POA: Diagnosis not present

## 2023-08-07 DIAGNOSIS — Z0000 Encounter for general adult medical examination without abnormal findings: Secondary | ICD-10-CM | POA: Diagnosis not present

## 2023-08-07 DIAGNOSIS — F1010 Alcohol abuse, uncomplicated: Secondary | ICD-10-CM | POA: Diagnosis not present

## 2023-08-07 DIAGNOSIS — S065XAA Traumatic subdural hemorrhage with loss of consciousness status unknown, initial encounter: Secondary | ICD-10-CM | POA: Diagnosis not present

## 2023-08-14 DIAGNOSIS — M6281 Muscle weakness (generalized): Secondary | ICD-10-CM | POA: Diagnosis not present

## 2023-08-14 DIAGNOSIS — R2681 Unsteadiness on feet: Secondary | ICD-10-CM | POA: Diagnosis not present

## 2023-08-14 DIAGNOSIS — M25561 Pain in right knee: Secondary | ICD-10-CM | POA: Diagnosis not present

## 2023-08-14 DIAGNOSIS — M25562 Pain in left knee: Secondary | ICD-10-CM | POA: Diagnosis not present

## 2023-08-17 DIAGNOSIS — R2681 Unsteadiness on feet: Secondary | ICD-10-CM | POA: Diagnosis not present

## 2023-08-17 DIAGNOSIS — M25561 Pain in right knee: Secondary | ICD-10-CM | POA: Diagnosis not present

## 2023-08-17 DIAGNOSIS — M25562 Pain in left knee: Secondary | ICD-10-CM | POA: Diagnosis not present

## 2023-08-17 DIAGNOSIS — M6281 Muscle weakness (generalized): Secondary | ICD-10-CM | POA: Diagnosis not present

## 2023-08-21 DIAGNOSIS — M6281 Muscle weakness (generalized): Secondary | ICD-10-CM | POA: Diagnosis not present

## 2023-08-21 DIAGNOSIS — M25561 Pain in right knee: Secondary | ICD-10-CM | POA: Diagnosis not present

## 2023-08-21 DIAGNOSIS — R2681 Unsteadiness on feet: Secondary | ICD-10-CM | POA: Diagnosis not present

## 2023-08-21 DIAGNOSIS — M25562 Pain in left knee: Secondary | ICD-10-CM | POA: Diagnosis not present

## 2023-08-22 ENCOUNTER — Ambulatory Visit: Payer: 59 | Admitting: Urology

## 2023-08-22 DIAGNOSIS — N5201 Erectile dysfunction due to arterial insufficiency: Secondary | ICD-10-CM | POA: Diagnosis not present

## 2023-08-22 DIAGNOSIS — N486 Induration penis plastica: Secondary | ICD-10-CM | POA: Diagnosis not present

## 2023-08-24 DIAGNOSIS — M25561 Pain in right knee: Secondary | ICD-10-CM | POA: Diagnosis not present

## 2023-08-24 DIAGNOSIS — R2681 Unsteadiness on feet: Secondary | ICD-10-CM | POA: Diagnosis not present

## 2023-08-24 DIAGNOSIS — M6281 Muscle weakness (generalized): Secondary | ICD-10-CM | POA: Diagnosis not present

## 2023-08-24 DIAGNOSIS — M25562 Pain in left knee: Secondary | ICD-10-CM | POA: Diagnosis not present

## 2023-08-28 DIAGNOSIS — R2681 Unsteadiness on feet: Secondary | ICD-10-CM | POA: Diagnosis not present

## 2023-08-28 DIAGNOSIS — M6281 Muscle weakness (generalized): Secondary | ICD-10-CM | POA: Diagnosis not present

## 2023-08-28 DIAGNOSIS — M25561 Pain in right knee: Secondary | ICD-10-CM | POA: Diagnosis not present

## 2023-08-28 DIAGNOSIS — M25562 Pain in left knee: Secondary | ICD-10-CM | POA: Diagnosis not present

## 2023-08-31 DIAGNOSIS — M6281 Muscle weakness (generalized): Secondary | ICD-10-CM | POA: Diagnosis not present

## 2023-08-31 DIAGNOSIS — M25562 Pain in left knee: Secondary | ICD-10-CM | POA: Diagnosis not present

## 2023-08-31 DIAGNOSIS — R2681 Unsteadiness on feet: Secondary | ICD-10-CM | POA: Diagnosis not present

## 2023-08-31 DIAGNOSIS — M25561 Pain in right knee: Secondary | ICD-10-CM | POA: Diagnosis not present

## 2023-09-07 DIAGNOSIS — M25561 Pain in right knee: Secondary | ICD-10-CM | POA: Diagnosis not present

## 2023-09-07 DIAGNOSIS — M25562 Pain in left knee: Secondary | ICD-10-CM | POA: Diagnosis not present

## 2023-09-07 DIAGNOSIS — M6281 Muscle weakness (generalized): Secondary | ICD-10-CM | POA: Diagnosis not present

## 2023-09-07 DIAGNOSIS — R2681 Unsteadiness on feet: Secondary | ICD-10-CM | POA: Diagnosis not present

## 2023-09-11 DIAGNOSIS — R2681 Unsteadiness on feet: Secondary | ICD-10-CM | POA: Diagnosis not present

## 2023-09-11 DIAGNOSIS — M25562 Pain in left knee: Secondary | ICD-10-CM | POA: Diagnosis not present

## 2023-09-11 DIAGNOSIS — M6281 Muscle weakness (generalized): Secondary | ICD-10-CM | POA: Diagnosis not present

## 2023-09-11 DIAGNOSIS — M25561 Pain in right knee: Secondary | ICD-10-CM | POA: Diagnosis not present

## 2023-09-18 DIAGNOSIS — N486 Induration penis plastica: Secondary | ICD-10-CM | POA: Diagnosis not present

## 2024-03-06 DIAGNOSIS — K7030 Alcoholic cirrhosis of liver without ascites: Secondary | ICD-10-CM | POA: Diagnosis not present

## 2024-03-06 DIAGNOSIS — S8266XS Nondisplaced fracture of lateral malleolus of unspecified fibula, sequela: Secondary | ICD-10-CM | POA: Diagnosis not present

## 2024-03-06 DIAGNOSIS — F325 Major depressive disorder, single episode, in full remission: Secondary | ICD-10-CM | POA: Diagnosis not present

## 2024-03-06 DIAGNOSIS — R739 Hyperglycemia, unspecified: Secondary | ICD-10-CM | POA: Diagnosis not present

## 2024-03-06 DIAGNOSIS — F1910 Other psychoactive substance abuse, uncomplicated: Secondary | ICD-10-CM | POA: Diagnosis not present

## 2024-03-06 DIAGNOSIS — M7552 Bursitis of left shoulder: Secondary | ICD-10-CM | POA: Diagnosis not present

## 2024-03-06 DIAGNOSIS — M25511 Pain in right shoulder: Secondary | ICD-10-CM | POA: Diagnosis not present

## 2024-03-06 DIAGNOSIS — F1010 Alcohol abuse, uncomplicated: Secondary | ICD-10-CM | POA: Diagnosis not present

## 2024-03-06 DIAGNOSIS — S065XAA Traumatic subdural hemorrhage with loss of consciousness status unknown, initial encounter: Secondary | ICD-10-CM | POA: Diagnosis not present

## 2024-03-06 DIAGNOSIS — G8929 Other chronic pain: Secondary | ICD-10-CM | POA: Diagnosis not present

## 2024-03-06 DIAGNOSIS — S72423F Displaced fracture of lateral condyle of unspecified femur, subsequent encounter for open fracture type IIIA, IIIB, or IIIC with routine healing: Secondary | ICD-10-CM | POA: Diagnosis not present

## 2024-03-06 DIAGNOSIS — S99911A Unspecified injury of right ankle, initial encounter: Secondary | ICD-10-CM | POA: Diagnosis not present

## 2024-03-06 DIAGNOSIS — R7989 Other specified abnormal findings of blood chemistry: Secondary | ICD-10-CM | POA: Diagnosis not present

## 2024-03-07 ENCOUNTER — Other Ambulatory Visit: Payer: Self-pay | Admitting: Cardiovascular Disease

## 2024-03-07 DIAGNOSIS — K7030 Alcoholic cirrhosis of liver without ascites: Secondary | ICD-10-CM

## 2024-03-14 ENCOUNTER — Telehealth: Payer: Self-pay

## 2024-03-14 NOTE — Telephone Encounter (Signed)
 Called pt twice to confirm change of appointment time. Unable to leave voicemail. Mychart sent to pt.

## 2024-03-15 ENCOUNTER — Ambulatory Visit

## 2024-03-18 DIAGNOSIS — M25671 Stiffness of right ankle, not elsewhere classified: Secondary | ICD-10-CM | POA: Diagnosis not present

## 2024-03-18 DIAGNOSIS — M6281 Muscle weakness (generalized): Secondary | ICD-10-CM | POA: Diagnosis not present

## 2024-03-21 DIAGNOSIS — M6281 Muscle weakness (generalized): Secondary | ICD-10-CM | POA: Diagnosis not present

## 2024-03-21 DIAGNOSIS — M25671 Stiffness of right ankle, not elsewhere classified: Secondary | ICD-10-CM | POA: Diagnosis not present

## 2024-03-28 DIAGNOSIS — M25671 Stiffness of right ankle, not elsewhere classified: Secondary | ICD-10-CM | POA: Diagnosis not present

## 2024-03-28 DIAGNOSIS — M6281 Muscle weakness (generalized): Secondary | ICD-10-CM | POA: Diagnosis not present

## 2024-04-01 DIAGNOSIS — M25671 Stiffness of right ankle, not elsewhere classified: Secondary | ICD-10-CM | POA: Diagnosis not present

## 2024-04-01 DIAGNOSIS — M6281 Muscle weakness (generalized): Secondary | ICD-10-CM | POA: Diagnosis not present

## 2024-04-04 DIAGNOSIS — M25671 Stiffness of right ankle, not elsewhere classified: Secondary | ICD-10-CM | POA: Diagnosis not present

## 2024-04-04 DIAGNOSIS — M6281 Muscle weakness (generalized): Secondary | ICD-10-CM | POA: Diagnosis not present

## 2024-04-07 DIAGNOSIS — M1990 Unspecified osteoarthritis, unspecified site: Secondary | ICD-10-CM | POA: Diagnosis not present

## 2024-04-07 DIAGNOSIS — F324 Major depressive disorder, single episode, in partial remission: Secondary | ICD-10-CM | POA: Diagnosis not present

## 2024-04-07 DIAGNOSIS — Z833 Family history of diabetes mellitus: Secondary | ICD-10-CM | POA: Diagnosis not present

## 2024-04-07 DIAGNOSIS — Z5941 Food insecurity: Secondary | ICD-10-CM | POA: Diagnosis not present

## 2024-04-07 DIAGNOSIS — R2681 Unsteadiness on feet: Secondary | ICD-10-CM | POA: Diagnosis not present

## 2024-04-07 DIAGNOSIS — Z8249 Family history of ischemic heart disease and other diseases of the circulatory system: Secondary | ICD-10-CM | POA: Diagnosis not present

## 2024-04-07 DIAGNOSIS — F1021 Alcohol dependence, in remission: Secondary | ICD-10-CM | POA: Diagnosis not present

## 2024-04-07 DIAGNOSIS — Z9181 History of falling: Secondary | ICD-10-CM | POA: Diagnosis not present

## 2024-04-07 DIAGNOSIS — Z5982 Transportation insecurity: Secondary | ICD-10-CM | POA: Diagnosis not present

## 2024-04-07 DIAGNOSIS — Z823 Family history of stroke: Secondary | ICD-10-CM | POA: Diagnosis not present

## 2024-04-07 DIAGNOSIS — Z5986 Financial insecurity: Secondary | ICD-10-CM | POA: Diagnosis not present

## 2024-04-07 DIAGNOSIS — Z5948 Other specified lack of adequate food: Secondary | ICD-10-CM | POA: Diagnosis not present

## 2024-04-08 DIAGNOSIS — M6281 Muscle weakness (generalized): Secondary | ICD-10-CM | POA: Diagnosis not present

## 2024-04-08 DIAGNOSIS — M25671 Stiffness of right ankle, not elsewhere classified: Secondary | ICD-10-CM | POA: Diagnosis not present

## 2024-04-11 DIAGNOSIS — M6281 Muscle weakness (generalized): Secondary | ICD-10-CM | POA: Diagnosis not present

## 2024-04-11 DIAGNOSIS — M25671 Stiffness of right ankle, not elsewhere classified: Secondary | ICD-10-CM | POA: Diagnosis not present

## 2024-04-12 ENCOUNTER — Ambulatory Visit

## 2024-04-16 DIAGNOSIS — N486 Induration penis plastica: Secondary | ICD-10-CM | POA: Diagnosis not present

## 2024-04-18 DIAGNOSIS — M6281 Muscle weakness (generalized): Secondary | ICD-10-CM | POA: Diagnosis not present

## 2024-04-18 DIAGNOSIS — M25671 Stiffness of right ankle, not elsewhere classified: Secondary | ICD-10-CM | POA: Diagnosis not present

## 2024-04-24 DIAGNOSIS — M25671 Stiffness of right ankle, not elsewhere classified: Secondary | ICD-10-CM | POA: Diagnosis not present

## 2024-04-24 DIAGNOSIS — M6281 Muscle weakness (generalized): Secondary | ICD-10-CM | POA: Diagnosis not present

## 2024-04-30 ENCOUNTER — Emergency Department

## 2024-04-30 ENCOUNTER — Encounter: Payer: Self-pay | Admitting: Emergency Medicine

## 2024-04-30 ENCOUNTER — Emergency Department
Admission: EM | Admit: 2024-04-30 | Discharge: 2024-04-30 | Disposition: A | Discharge status: Home / Self Care | Attending: Emergency Medicine | Admitting: Emergency Medicine

## 2024-04-30 ENCOUNTER — Other Ambulatory Visit: Payer: Self-pay

## 2024-04-30 DIAGNOSIS — S72351A Displaced comminuted fracture of shaft of right femur, initial encounter for closed fracture: Secondary | ICD-10-CM | POA: Diagnosis not present

## 2024-04-30 DIAGNOSIS — T84012A Broken internal right knee prosthesis, initial encounter: Secondary | ICD-10-CM | POA: Insufficient documentation

## 2024-04-30 DIAGNOSIS — J45909 Unspecified asthma, uncomplicated: Secondary | ICD-10-CM | POA: Insufficient documentation

## 2024-04-30 DIAGNOSIS — M85861 Other specified disorders of bone density and structure, right lower leg: Secondary | ICD-10-CM | POA: Diagnosis not present

## 2024-04-30 DIAGNOSIS — T84019A Broken internal joint prosthesis, unspecified site, initial encounter: Secondary | ICD-10-CM

## 2024-04-30 DIAGNOSIS — M1711 Unilateral primary osteoarthritis, right knee: Secondary | ICD-10-CM | POA: Diagnosis not present

## 2024-04-30 DIAGNOSIS — I10 Essential (primary) hypertension: Secondary | ICD-10-CM | POA: Insufficient documentation

## 2024-04-30 DIAGNOSIS — M25561 Pain in right knee: Secondary | ICD-10-CM | POA: Diagnosis not present

## 2024-04-30 LAB — BASIC METABOLIC PANEL WITH GFR
Anion gap: 11 (ref 5–15)
BUN: 7 mg/dL (ref 6–20)
CO2: 23 mmol/L (ref 22–32)
Calcium: 9.1 mg/dL (ref 8.9–10.3)
Chloride: 103 mmol/L (ref 98–111)
Creatinine, Ser: 0.73 mg/dL (ref 0.61–1.24)
GFR, Estimated: >60 mL/min (ref >60)
Glucose, Bld: 138 mg/dL — ABNORMAL HIGH (ref 70–99)
Potassium: 4 mmol/L (ref 3.5–5.1)
Sodium: 137 mmol/L (ref 135–145)

## 2024-04-30 LAB — C-REACTIVE PROTEIN: CRP: 1.3 mg/dL — ABNORMAL HIGH (ref <1.0)

## 2024-04-30 LAB — CBC
HCT: 43.8 % (ref 39.0–52.0)
Hemoglobin: 14.6 g/dL (ref 13.0–17.0)
MCH: 31.1 pg (ref 26.0–34.0)
MCHC: 33.3 g/dL (ref 30.0–36.0)
MCV: 93.2 fL (ref 80.0–100.0)
Platelets: 146 10*3/uL — ABNORMAL LOW (ref 150–400)
RBC: 4.7 MIL/uL (ref 4.22–5.81)
RDW: 12.4 % (ref 11.5–15.5)
WBC: 6 10*3/uL (ref 4.0–10.5)
nRBC: 0 % (ref 0.0–0.2)

## 2024-04-30 LAB — SEDIMENTATION RATE: Sed Rate: 30 mm/h — ABNORMAL HIGH (ref 0–20)

## 2024-04-30 MED ORDER — HYDROCODONE-ACETAMINOPHEN 5-325 MG PO TABS
1.0000 | ORAL_TABLET | Freq: Once | ORAL | Status: AC
  Administered 2024-04-30: 1 via ORAL
  Filled 2024-04-30: qty 1

## 2024-04-30 MED ORDER — OXYCODONE-ACETAMINOPHEN 5-325 MG PO TABS: 1.0000 | ORAL_TABLET | ORAL | 0 refills | Status: DC | PRN
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
## 2024-04-30 NOTE — Discharge Instructions (Addendum)
 right distal femur fracture please wear your knee immobilizer.  Do not place any weight on the right knee.  You should be receiving a phone call from [facility] to arrange follow-up this week for likely surgery on Friday.  Please take your pain medication as needed but only as prescribed.  Do not drink alcohol or drive while taking pain medication.

## 2024-05-02 DIAGNOSIS — S72352D Displaced comminuted fracture of shaft of left femur, subsequent encounter for closed fracture with routine healing: Secondary | ICD-10-CM | POA: Diagnosis not present

## 2024-05-02 DIAGNOSIS — M25561 Pain in right knee: Secondary | ICD-10-CM | POA: Diagnosis not present

## 2024-05-02 DIAGNOSIS — S2232XA Fracture of one rib, left side, initial encounter for closed fracture: Secondary | ICD-10-CM | POA: Diagnosis not present

## 2024-05-02 DIAGNOSIS — S82102D Unspecified fracture of upper end of left tibia, subsequent encounter for closed fracture with routine healing: Secondary | ICD-10-CM | POA: Diagnosis not present

## 2024-05-02 DIAGNOSIS — S82402D Unspecified fracture of shaft of left fibula, subsequent encounter for closed fracture with routine healing: Secondary | ICD-10-CM | POA: Diagnosis not present

## 2024-05-02 DIAGNOSIS — S72401K Unspecified fracture of lower end of right femur, subsequent encounter for closed fracture with nonunion: Secondary | ICD-10-CM | POA: Diagnosis not present

## 2024-05-02 DIAGNOSIS — S7291XA Unspecified fracture of right femur, initial encounter for closed fracture: Secondary | ICD-10-CM | POA: Diagnosis not present

## 2024-05-02 DIAGNOSIS — T84018A Broken internal joint prosthesis, other site, initial encounter: Secondary | ICD-10-CM | POA: Diagnosis not present

## 2024-05-02 DIAGNOSIS — S7291XN Unspecified fracture of right femur, subsequent encounter for open fracture type IIIA, IIIB, or IIIC with nonunion: Secondary | ICD-10-CM | POA: Insufficient documentation

## 2024-05-02 DIAGNOSIS — S72351D Displaced comminuted fracture of shaft of right femur, subsequent encounter for closed fracture with routine healing: Secondary | ICD-10-CM | POA: Diagnosis not present

## 2024-05-03 DIAGNOSIS — S7291XA Unspecified fracture of right femur, initial encounter for closed fracture: Secondary | ICD-10-CM | POA: Diagnosis not present

## 2024-05-03 DIAGNOSIS — S72301K Unspecified fracture of shaft of right femur, subsequent encounter for closed fracture with nonunion: Secondary | ICD-10-CM | POA: Diagnosis not present

## 2024-05-05 DIAGNOSIS — S72401A Unspecified fracture of lower end of right femur, initial encounter for closed fracture: Secondary | ICD-10-CM | POA: Diagnosis not present

## 2024-05-05 DIAGNOSIS — Z9889 Other specified postprocedural states: Secondary | ICD-10-CM | POA: Diagnosis not present

## 2024-05-06 DIAGNOSIS — Z79899 Other long term (current) drug therapy: Secondary | ICD-10-CM | POA: Diagnosis not present

## 2024-05-06 DIAGNOSIS — R52 Pain, unspecified: Secondary | ICD-10-CM | POA: Diagnosis not present

## 2024-05-06 DIAGNOSIS — R2689 Other abnormalities of gait and mobility: Secondary | ICD-10-CM | POA: Diagnosis not present

## 2024-05-06 DIAGNOSIS — S72491S Other fracture of lower end of right femur, sequela: Secondary | ICD-10-CM | POA: Diagnosis not present

## 2024-05-07 ENCOUNTER — Telehealth: Payer: Self-pay

## 2024-05-07 ENCOUNTER — Ambulatory Visit

## 2024-05-07 NOTE — Telephone Encounter (Signed)
 Copied from CRM [id number]. Topic: Appointments - Scheduling Inquiry for Clinic >> May 06, 2024 11:07 AM Adonis Hoot wrote: Reason for CRM: Patient is scheduled for new patient appointment tomorrow 05/07/2024 with Dr Casimir Cleaver. He is currently hospitalized due to having an emergency surgery on his leg.  He will need to have an established PCP when he is released to get any refills on medications.He would like to know if there any way that he will be able to get fitted in for a new patient appointment when he is released in the next week  or 2?  I spoke with patient and let him know that we received his message.  Patient states he is still in the hospital and has not decided whether or not he will be going to rehab.  I asked patient to please have his nurse call us  when he is discharged to go home, whether it is from the hospital or from rehab, and we will check our availability at that time.

## 2024-05-08 DIAGNOSIS — S7291XA Unspecified fracture of right femur, initial encounter for closed fracture: Secondary | ICD-10-CM | POA: Diagnosis not present

## 2024-05-08 DIAGNOSIS — L700 Acne vulgaris: Secondary | ICD-10-CM | POA: Diagnosis not present

## 2024-05-10 DIAGNOSIS — S7291XN Unspecified fracture of right femur, subsequent encounter for open fracture type IIIA, IIIB, or IIIC with nonunion: Secondary | ICD-10-CM | POA: Diagnosis not present

## 2024-05-23 ENCOUNTER — Ambulatory Visit (INDEPENDENT_AMBULATORY_CARE_PROVIDER_SITE_OTHER)

## 2024-05-23 VITALS — BP 122/80 | HR 84 | Temp 99.6°F | Ht 67.0 in | Wt 170.0 lb

## 2024-05-23 DIAGNOSIS — E785 Hyperlipidemia, unspecified: Secondary | ICD-10-CM | POA: Diagnosis not present

## 2024-05-23 DIAGNOSIS — N486 Induration penis plastica: Secondary | ICD-10-CM | POA: Diagnosis not present

## 2024-05-23 DIAGNOSIS — R7401 Elevation of levels of liver transaminase levels: Secondary | ICD-10-CM | POA: Diagnosis not present

## 2024-05-23 DIAGNOSIS — D539 Nutritional anemia, unspecified: Secondary | ICD-10-CM | POA: Diagnosis not present

## 2024-05-23 DIAGNOSIS — K7030 Alcoholic cirrhosis of liver without ascites: Secondary | ICD-10-CM | POA: Diagnosis not present

## 2024-05-23 DIAGNOSIS — R748 Abnormal levels of other serum enzymes: Secondary | ICD-10-CM

## 2024-05-23 DIAGNOSIS — F1010 Alcohol abuse, uncomplicated: Secondary | ICD-10-CM

## 2024-05-23 DIAGNOSIS — S728X1N Other fracture of right femur, subsequent encounter for open fracture type IIIA, IIIB, or IIIC with nonunion: Secondary | ICD-10-CM

## 2024-05-23 DIAGNOSIS — M130 Polyarthritis, unspecified: Secondary | ICD-10-CM | POA: Diagnosis not present

## 2024-05-23 DIAGNOSIS — M2550 Pain in unspecified joint: Secondary | ICD-10-CM

## 2024-05-23 DIAGNOSIS — F39 Unspecified mood [affective] disorder: Secondary | ICD-10-CM | POA: Diagnosis not present

## 2024-05-23 DIAGNOSIS — F339 Major depressive disorder, recurrent, unspecified: Secondary | ICD-10-CM | POA: Diagnosis not present

## 2024-05-23 DIAGNOSIS — I10 Essential (primary) hypertension: Secondary | ICD-10-CM

## 2024-05-23 DIAGNOSIS — F1910 Other psychoactive substance abuse, uncomplicated: Secondary | ICD-10-CM

## 2024-05-23 DIAGNOSIS — J45909 Unspecified asthma, uncomplicated: Secondary | ICD-10-CM | POA: Diagnosis not present

## 2024-05-23 MED ORDER — TRAZODONE HCL 100 MG PO TABS: 100.0000 mg | ORAL_TABLET | Freq: Every day | ORAL | 0 refills | Status: AC

## 2024-05-23 MED ORDER — ATORVASTATIN CALCIUM 40 MG PO TABS: 40.0000 mg | ORAL_TABLET | Freq: Every day | ORAL | 3 refills | Status: AC

## 2024-05-23 MED ORDER — VITAMIN B-12 100 MCG PO TABS: 100.0000 ug | ORAL_TABLET | Freq: Every day | ORAL | 2 refills | Status: DC

## 2024-05-23 MED ORDER — ALBUTEROL SULFATE HFA 108 (90 BASE) MCG/ACT IN AERS: 2.0000 | INHALATION_SPRAY | Freq: Four times a day (QID) | RESPIRATORY_TRACT | 2 refills | Status: DC | PRN

## 2024-05-23 MED ORDER — HYDROXYZINE HCL 25 MG PO TABS: 25.0000 mg | ORAL_TABLET | Freq: Every day | ORAL | 0 refills | Status: AC | PRN

## 2024-05-23 MED ORDER — FUROSEMIDE 40 MG PO TABS: 40.0000 mg | ORAL_TABLET | Freq: Every day | ORAL | 0 refills | Status: AC

## 2024-05-23 MED ORDER — SERTRALINE HCL 100 MG PO TABS: 100.0000 mg | ORAL_TABLET | Freq: Every day | ORAL | 3 refills | Status: AC

## 2024-05-23 NOTE — Assessment & Plan Note (Signed)
 Stable on PRN Albuterol  2 puffs q6 hourly. Refill sent. If needing frequently recommend reach out to us , will need PFT.

## 2024-05-23 NOTE — Assessment & Plan Note (Signed)
 Continue Atorvastatin 40 mg at bedtime, refill sent.

## 2024-05-23 NOTE — Assessment & Plan Note (Signed)
 Plan per mood disorder.
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
## 2024-05-23 NOTE — Assessment & Plan Note (Addendum)
 Worse with pain. No SI/HI. Gets frustrated about cost of medications, quality of life due to pain. Continue Hydroxyzine 25 mg once a day, Sertraline  100 mg once a day, Trazodone  100 mg once a day. Refill sent.  Social worker, Charity fundraiser, SDOH referral made today to help patient with meeting medical, social, financial needs to improve overall health.

## 2024-05-23 NOTE — Patient Instructions (Addendum)
--   I am putting a referral for [facility], rheumatology. I am also referring you to pain clinic with [facility]. If you do not see pain clinic within next 3 weeks please reach out to us  and I can send refill on Gabapentin. Otherwise you do not need refill for Gabapentin right.   -- Please check BP at home (3 times a week), if your home BP reading or BP readings at doctor's office is over 130/80 mmHg I will recommend restating Amlodipine  at 2.5 mg once a day dose.   -- Please reach out to your insurance or local pharmacy to update your hepatitis A and B immunization.

## 2024-05-23 NOTE — Assessment & Plan Note (Signed)
 Likely multifactorial, alcohol abuse, recent surgeries. Repeat CBC, B 12.

## 2024-05-23 NOTE — Assessment & Plan Note (Addendum)
 Plan per substance abuse form today.  Check vitamin D .

## 2024-05-23 NOTE — Assessment & Plan Note (Signed)
 Likely from alcoholic liver cirrhosis, repeat CMP. [facility] referral made today.

## 2024-05-23 NOTE — Assessment & Plan Note (Addendum)
 Patient is not going to AA,  is back to drinking alcohol 2-3 beer during weekends.  Encouraged reestablishing/connecting with mentor, AA group.  Counseled on alcohol related adverse health outcome specially given h/o cirrhosis.  Social worker, Charity fundraiser, SDOH referral made today.

## 2024-05-23 NOTE — Assessment & Plan Note (Signed)
 Not currently on Amlodipine . BP at goal today. Check BP three times a week at home. Goal <130/80 mmHg, if higher than goal will restart Amlodipine  2.5 mg once a day. Is already on Lasix  for liver cirrhosis which should help with BP control as well.

## 2024-05-23 NOTE — Assessment & Plan Note (Signed)
 Underwent surgery about 2 weeks ago. Continue f/u and management per [facility] ortho.

## 2024-05-23 NOTE — Assessment & Plan Note (Addendum)
 Plan per polyarthralgia. Referral to pain clinic made today.

## 2024-05-23 NOTE — Assessment & Plan Note (Addendum)
 Involving b/l shoulder, knee, ankle.  Has a h/o elevated ESR, CRP.  Previously seen [facility] rheumatology.  Referral to [facility] rheumatology made today.  Already on Gabapentin, Methocarbamol, Norco (from surgeon).  Not due for refill on Gabapentin, PDMP reviewed last refill 05/09/24 (90 cap). If patient is not able to establish care with pain clinic by three weeks he will reach out to our office for refill request.

## 2024-05-23 NOTE — Assessment & Plan Note (Signed)
 Not taking Cialis. Symptoms has improved despite not being on the medication. Continue f/u and management with urology at [facility].

## 2024-05-27 ENCOUNTER — Telehealth: Payer: Self-pay

## 2024-05-27 ENCOUNTER — Ambulatory Visit: Payer: Self-pay

## 2024-05-27 DIAGNOSIS — D539 Nutritional anemia, unspecified: Secondary | ICD-10-CM

## 2024-05-27 DIAGNOSIS — E559 Vitamin D deficiency, unspecified: Secondary | ICD-10-CM

## 2024-05-27 DIAGNOSIS — R748 Abnormal levels of other serum enzymes: Secondary | ICD-10-CM

## 2024-05-27 NOTE — Progress Notes (Signed)
 Please call the patient to update him on his recent lab results (has my chart but last log in was in 02/2024):  - Borderline low hemoglobin or anemia, likely related to recent surgical procedure. I recommend repeating CBC in 3-4 weeks to make sure it's improving. B 12 is normal. Waiting on result for vitamin D  level, will reach out to him once that is back.   - One of the liver enzyme called alkaline phosphatase is elevated, could be related to underlying liver cirrhosis, recent surgery and healing fracture. Recommend alcohol abstinence and repeat liver function in 3-4 weeks. Future lab ordered.   Thank you,  Luke Shade, MD

## 2024-05-27 NOTE — Progress Notes (Signed)
 Complex Care Management Note Care Guide Note  05/27/2024 Name: Cameron [name] MRN: [id number] DOB: 25-Nov-1970   Complex Care Management Outreach Attempts: An unsuccessful telephone outreach was attempted today to offer the patient information about available complex care management services.  Follow Up Plan:  Additional outreach attempts will be made to offer the patient complex care management information and services.   Encounter Outcome:  No Answer  Jeoffrey Buffalo , RMA     [place]  [facility], [facility] Guide  Direct Dial: [phone]  Website: [email or web address]

## 2024-05-27 NOTE — Progress Notes (Signed)
 Complex Care Management Note  Care Guide Note 05/27/2024 Name: RYEN RHAMES III MRN: [id number] DOB: 08-27-1971  Carlin LITTIE Buttner III is a 53 y.o. year old male who sees Bair, Kalpana, MD for primary care. I reached out to Carlin LITTIE Buttner III by phone today to offer complex care management services.  Mr. Alen was given information about Complex Care Management services today including:   The Complex Care Management services include support from the care team which includes your Nurse Care Manager, Clinical Social Worker, or Pharmacist.  The Complex Care Management team is here to help remove barriers to the health concerns and goals most important to you. Complex Care Management services are voluntary, and the patient may decline or stop services at any time by request to their care team member.   Complex Care Management Consent Status: Patient agreed to services and verbal consent obtained.   Follow up plan:  Telephone appointment with complex care management team member scheduled for:  LCSW 05/29/2024 RNCM 06/06/2024  Encounter Outcome:  Patient Scheduled  Jeoffrey Buffalo , RMA       [facility], [facility] Guide  Direct Dial: [phone]  Website: [email or web address]

## 2024-05-29 ENCOUNTER — Other Ambulatory Visit: Payer: Self-pay | Admitting: Licensed Clinical Social Worker
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
## 2024-05-30 ENCOUNTER — Telehealth: Payer: Self-pay | Admitting: *Deleted

## 2024-05-30 LAB — CBC WITH DIFFERENTIAL/PLATELET
Basophils Absolute: 0.1 x10E3/uL (ref 0.0–0.2)
Basos: 2 %
EOS (ABSOLUTE): 0.2 x10E3/uL (ref 0.0–0.4)
Eos: 3 %
Hematocrit: 39.9 % (ref 37.5–51.0)
Hemoglobin: 11.8 g/dL — ABNORMAL LOW (ref 13.0–17.7)
Immature Grans (Abs): 0 x10E3/uL (ref 0.0–0.1)
Immature Granulocytes: 0 %
Lymphocytes Absolute: 1.9 x10E3/uL (ref 0.7–3.1)
Lymphs: 34 %
MCH: 29.1 pg (ref 26.6–33.0)
MCHC: 29.6 g/dL — ABNORMAL LOW (ref 31.5–35.7)
MCV: 98 fL — ABNORMAL HIGH (ref 79–97)
Monocytes Absolute: 0.5 x10E3/uL (ref 0.1–0.9)
Monocytes: 9 %
Neutrophils Absolute: 2.8 x10E3/uL (ref 1.4–7.0)
Neutrophils: 52 %
Platelets: 216 x10E3/uL (ref 150–450)
RBC: 4.06 x10E6/uL — ABNORMAL LOW (ref 4.14–5.80)
RDW: 12.7 % (ref 11.6–15.4)
WBC: 5.5 x10E3/uL (ref 3.4–10.8)

## 2024-05-30 LAB — COMPREHENSIVE METABOLIC PANEL WITH GFR
ALT: 9 IU/L (ref 0–44)
AST: 24 IU/L (ref 0–40)
Albumin: 4.3 g/dL (ref 3.8–4.9)
Alkaline Phosphatase: 181 IU/L — ABNORMAL HIGH (ref 44–121)
BUN/Creatinine Ratio: 8 — ABNORMAL LOW (ref 9–20)
BUN: 7 mg/dL (ref 6–24)
Bilirubin Total: 0.6 mg/dL (ref 0.0–1.2)
CO2: 22 mmol/L (ref 20–29)
Calcium: 9.6 mg/dL (ref 8.7–10.2)
Chloride: 98 mmol/L (ref 96–106)
Creatinine, Ser: 0.84 mg/dL (ref 0.76–1.27)
Globulin, Total: 3.5 g/dL (ref 1.5–4.5)
Glucose: 91 mg/dL (ref 70–99)
Potassium: 5.2 mmol/L (ref 3.5–5.2)
Sodium: 138 mmol/L (ref 134–144)
Total Protein: 7.8 g/dL (ref 6.0–8.5)
eGFR: 105 mL/min/1.73 (ref >59)

## 2024-05-30 LAB — VITAMIN B12: Vitamin B-12: 558 pg/mL (ref 232–1245)

## 2024-05-30 LAB — VITAMIN D 25 HYDROXY (VIT D DEFICIENCY, FRACTURES): Vit D, 25-Hydroxy: 14.9 ng/mL — AB (ref 30.0–100.0)

## 2024-05-30 MED ORDER — VITAMIN D (ERGOCALCIFEROL) 1.25 MG (50000 UNIT) PO CAPS: 50000.0000 [IU] | ORAL_CAPSULE | ORAL | 0 refills | Status: AC

## 2024-05-30 NOTE — Progress Notes (Signed)
 Complex Care Management Note Care Guide Note  05/30/2024 Name: Cameron [name] MRN: [id number] DOB: 1971/03/18   Complex Care Management Outreach Attempts: An unsuccessful telephone outreach was attempted today to offer the patient information about available complex care management services.  Follow Up Plan:  Additional outreach attempts will be made to offer the patient complex care management information and services.   Encounter Outcome:  No Answer [facility] Care Guide  Direct Dial:[phone] Fax:[phone] Website: [email or web address]

## 2024-05-30 NOTE — Progress Notes (Signed)
 Please let the patient know he has vitamin D  deficiency. I recommend taking vitamin D  once a week for 3 months and repeat lab. Prescription and future lab ordered.   Thank you,  Luke Shade, MD

## 2024-05-31 ENCOUNTER — Telehealth: Payer: Self-pay | Admitting: *Deleted

## 2024-05-31 NOTE — Progress Notes (Signed)
 Complex Care Management Note Care Guide Note  05/31/2024 Name: KENNY REA III MRN: [id number] DOB: [birth year]  Carlin LITTIE Buttner III is a 53 y.o. year old male who is a primary care patient of Bair, Kalpana, MD . The community resource team was consulted for assistance with [place]   SDOH screenings and interventions completed:  Yes     SDOH Interventions Today    Flowsheet Row Most Recent Value  SDOH Interventions   Housing Interventions Community Resources Provided  [patient advised to get on housing lists at housing authority will email resources for this]     Care guide performed the following interventions: Patient provided with information about care guide support team and interviewed to confirm resource needs.  Follow Up Plan:  No further follow up planned at this time. The patient has been provided with needed resources.  Encounter Outcome:  Patient Visit Completed  Nikira Kushnir Greenauer-Moran  [facility] Care Guide  Direct Dial:[phone] Fax:[phone] Website: [email or web address]

## 2024-06-06 ENCOUNTER — Other Ambulatory Visit: Payer: Self-pay

## 2024-06-06 NOTE — Patient Instructions (Addendum)
 Visit Information  Thank you for taking time to visit with me today. Please don't hesitate to contact me if I can be of assistance to you before our next scheduled appointment.  Our next appointment is by telephone on 06/21/2024  at 2:00 pm Please call the care guide team at [phone] if you need to cancel or reschedule your appointment.   Following is a copy of your care plan:   Goals Addressed             This Visit's Progress    [facility] RN Care Plan - Chronic Pain       Problems:  Chronic Disease Management support and education needs related to Chronic Pain  Goal: Over the next 90 days the Patient will attend all scheduled medical appointments: PCP as evidenced by no missed appointments        continue to work with RN Care Manager and/or Social Worker to address care management and care coordination needs related to Chronic pain as evidenced by adherence to care management team scheduled appointments     take all medications exactly as prescribed and will call provider for medication related questions as evidenced by Patient verbalizing compliance with all medications    verbalize understanding of plan for management of Chronic pain as evidenced by Attending Pain Management appointment (RNCM notified PCP patient not yet called by office to schedule) taking all pain medications as prescribed, completing visits with LCSW to assist with mood and develop additional coping strategies, consider implementing pain relief techniques such as heat/cold, gentle massage and stretching, distraction, diversional activities,   Interventions:   Pain Interventions: Pain assessment performed Medications reviewed Discussed importance of adherence to all scheduled medical appointments Counseled on the importance of reporting any/all new or changed pain symptoms or management strategies to pain management provider Discussed use of relaxation techniques and/or diversional activities to assist with pain  reduction (distraction, imagery, relaxation, massage, acupressure, TENS, heat, and cold application Reviewed with patient prescribed pharmacological and nonpharmacological pain relief strategies Screening for signs and symptoms of depression related to chronic disease state  Assessed social determinant of health barriers  Patient Self-Care Activities:  Attend all scheduled provider appointments Call pharmacy for medication refills 3-7 days in advance of running out of medications Call provider office for new concerns or questions  Take medications as prescribed    Plan:  Telephone follow up appointment with care management team member scheduled for:  2 Cameron [name]             Please call the Suicide and Crisis Lifeline: 988 call the [place]  National Suicide Prevention Lifeline: 515-617-2944 or TTY: 515 225 3089 TTY 312-795-7461) to talk to a trained counselor call 1-800-273-TALK (toll free, 24 hour hotline) go to [facility] [address] [place] [phone]) call 911 if you are experiencing a Mental Health or Behavioral Health Crisis or need someone to talk to.  Patient verbalizes understanding of instructions and care plan provided today and agrees to view in MyChart. Active MyChart status and patient understanding of how to access instructions and care plan via MyChart confirmed with patient.     Nestora Duos, MSN, RN [place]  [facility], [facility] RN Care Manager Direct Dial: [phone] Fax: [phone]  Chronic Pain, Adult Chronic pain is a type of pain that lasts or keeps coming back for at least 3-6 months. You may have headaches, pain in the abdomen, or pain in other areas of the body. Chronic pain may  be related to an illness, injury, or a health condition. Sometimes, the cause of chronic pain is not known. Chronic pain can make it hard for you to do daily activities. If it is not treated, chronic  pain can lead to anxiety and depression. Treatment depends on the cause of your pain and how severe it is. You may need to work with a pain specialist to come up with a treatment plan. Many people benefit from two or more types of treatment to control their pain. Follow these instructions at home: Treatment plan Follow your treatment plan as told by your health care provider. This may include: Gentle, regular exercise. Eating a healthy diet that includes foods such as vegetables, fruits, fish, and lean meats. Mental health therapy (cognitive or behavioral therapy) that changes the way you think or act in response to the pain. This may help improve how you feel. Doing physical therapy exercises to improve movement and strength. Meditation, yoga, acupuncture, or massage therapy. Using the oils from plants in your environment or on your skin (aromatherapy). Other treatments may include: Over-the-counter or prescription medicines. Color, light, or sound therapy. Local electrical stimulation. The electrical pulses help to relieve pain by temporarily stopping the nerve impulses that cause you to feel pain. Injections. These deliver numbing or pain-relieving medicines into the spine or the area of pain.  Medicines Take over-the-counter and prescription medicines only as told by your health care provider. Ask your health care provider if the medicine prescribed to you: Requires you to avoid driving or using machinery. Can cause constipation. You may need to take these actions to prevent or treat constipation: Drink enough fluid to keep your urine pale yellow. Take over-the-counter or prescription medicines. Eat foods that are high in fiber, such as beans, whole grains, and fresh fruits and vegetables. Limit foods that are high in fat and processed sugars, such as fried or sweet foods. Lifestyle  Ask your health care provider whether you should keep a pain diary. Your health care provider will tell  you what information to write in the diary. This may include: When you have pain. What the pain feels like. How medicines and other behaviors or treatments help to reduce the pain. Consider talking with a mental health care provider about how to help manage chronic pain. Consider joining a chronic pain support group. Try to control or lower your stress levels. Talk with your health care provider about ways to do this. General instructions Learn as much as you can about how to manage your chronic pain. Ask your health care provider if an intensive pain rehabilitation program or a chronic pain specialist would be helpful. Check your pain level as told by your health care provider. Ask your health care provider if you should use a pain scale. Contact a health care provider if: Your pain is not controlled with treatment. You have new pain. You have side effects from pain medicine. You feel weak or you have trouble doing your normal activities. You have trouble sleeping or you develop confusion. You lose feeling or have numbness in your body. You lose control of your bowels or bladder. Get help right away if: Your pain suddenly gets much worse. You develop chest pain. You have trouble breathing or shortness of breath. You faint, or another person sees you faint. These symptoms may be an emergency. Get help right away. Call 911. Do not wait to see if the symptoms will go away. Do not drive yourself to the hospital.  Also, get help right away if: You have thoughts about hurting yourself or others. Take one of these steps if you feel like you may hurt yourself or others, or have thoughts about taking your own life: Go to your nearest emergency room. Call 911. Call the National Suicide Prevention Lifeline at 743-805-7642 or 988. This is open 24 hours a day. Text the Crisis Text Line at 408 752 0670. This information is not intended to replace advice given to you by your health care provider. Make  sure you discuss any questions you have with your health care provider. Document Revised: 06/29/2022 Document Reviewed: 06/01/2022 Elsevier Patient Education  2024 [employer] Inc.  Managing Stress, Adult Feeling a certain amount of stress is normal. Stress helps our body and mind get ready to deal with the demands of life. Stress hormones can motivate you to do well at work and meet your responsibilities. But severe or long-term (chronic) stress can affect your mental and physical health. Chronic stress puts you at higher risk for: Anxiety and depression. Other health problems such as digestive problems, muscle aches, heart disease, high blood pressure, and stroke. What are the causes? Common causes of stress include: Demands from work, such as deadlines, feeling overworked, or having long hours. Pressures at home, such as money issues, disagreements with a spouse, or parenting issues. Pressures from major life changes, such as divorce, moving, loss of a loved one, or chronic illness. You may be at higher risk for stress-related problems if you: Do not get enough sleep. Are in poor health. Do not have emotional support. Have a mental health disorder such as anxiety or depression. How to recognize stress Stress can make you: Have trouble sleeping. Feel sad, anxious, irritable, or overwhelmed. Lose your appetite. Overeat or want to eat unhealthy foods. Want to use drugs or alcohol. Stress can also cause physical symptoms, such as: Sore, tense muscles, especially in the shoulders and neck. Headaches. Trouble breathing. A faster heart rate. Stomach pain, nausea, or vomiting. Diarrhea or constipation. Trouble concentrating. Follow these instructions at home: Eating and drinking Eat a healthy diet. This includes: Eating foods that are high in fiber, such as beans, whole grains, and fresh fruits and vegetables. Limiting foods that are high in fat and processed sugars, such as fried or  sweet foods. Do not skip meals or overeat. Drink enough fluid to keep your urine pale yellow. Alcohol use Do not drink alcohol if: Your health care provider tells you not to drink. You are pregnant, may be pregnant, or are planning to become pregnant. Drinking alcohol is a way some people try to ease their stress. This can be dangerous, so if you drink alcohol: Limit how much you have to: 0-1 drink a day for women. 0-2 drinks a day for men. Know how much alcohol is in your drink. In the [place] one drink equals one 12 oz bottle of beer (355 mL), one 5 oz glass of wine (148 mL), or one 1 oz glass of hard liquor (44 mL). Activity  Include 30 minutes of exercise in your daily schedule. Exercise is a good stress reducer. Include time in your day for an activity that you find relaxing. Try taking a walk, going on a bike ride, reading a book, or listening to music. Schedule your time in a way that lowers stress, and keep a regular schedule. Focus on doing what is most important to get done. Lifestyle Identify the source of your stress and your reaction to it. See a  therapist who can help you change unhelpful reactions. When there are stressful events: Talk about them with family, friends, or coworkers. Try to think realistically about stressful events and not ignore them or overreact. Try to find the positives in a stressful situation and not focus on the negatives. Cut back on responsibilities at work and home, if possible. Ask for help from friends or family members if you need it. Find ways to manage stress, such as: Mindfulness, meditation, or deep breathing. Yoga or tai chi. Progressive muscle relaxation. Spending time in nature. Doing art, playing music, or reading. Making time for fun activities. Spending time with family and friends. Get support from family, friends, or spiritual resources. General instructions Get enough sleep. Try to go to sleep and get up at about the same time  every day. Take over-the-counter and prescription medicines only as told by your health care provider. Do not use any products that contain nicotine or tobacco. These products include cigarettes, chewing tobacco, and vaping devices, such as e-cigarettes. If you need help quitting, ask your health care provider. Do not use drugs or smoke to deal with stress. Keep all follow-up visits. This is important. Where to find support Talk with your health care provider about stress management or finding a support group. Find a therapist to work with you on your stress management techniques. Where to find more information [employer] on Mental Illness: www.nami.org American Psychological Association: [employer] Contact a health care provider if: Your stress symptoms get worse. You are unable to manage your stress at home. You are struggling to stop using drugs or alcohol. Get help right away if: You may be a danger to yourself or others. You have any thoughts of death or suicide. Get help right awayif you feel like you may hurt yourself or others, or have thoughts about taking your own life. Go to your nearest emergency room or: Call 911. Call the National Suicide Prevention Lifeline at 828-777-0912 or 988 in the [place] This is open 24 hours a day. If you're a Veteran: Call 988 and press 1. This is open 24 hours a day. Text the [employer] at 7632592144. Summary Feeling a certain amount of stress is normal, but severe or long-term (chronic) stress can affect your mental and physical health. Chronic stress can put you at higher risk for anxiety, depression, and other health problems such as digestive problems, muscle aches, heart disease, high blood pressure, and stroke. You may be at higher risk for stress-related problems if you do not get enough sleep, are in poor health, lack emotional support, or have a mental health disorder such as anxiety or depression. Identify the source of  your stress and your reaction to it. Try talking about stressful events with family, friends, or coworkers, finding a coping method, or getting support from spiritual resources. If you need more help, talk with your health care provider about finding a support group or a mental health therapist. This information is not intended to replace advice given to you by your health care provider. Make sure you discuss any questions you have with your health care provider. Document Revised: 06/22/2023 Document Reviewed: 06/01/2021 Elsevier Patient Education  2024 [employer].
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
## 2024-06-07 ENCOUNTER — Telehealth: Payer: Self-pay

## 2024-06-07 NOTE — Telephone Encounter (Signed)
 Copied from CRM [id number]. Topic: Clinical - Medical Advice >> Jun 07, 2024  3:51 PM Cameron [name] wrote: Reason for CRM:  Patient calling in stating that pain management has not reached out to him and wants to let Dr. Abbey know.  Patient would also like to ask some questions about his anemia with the nurse or doctor.

## 2024-06-10 NOTE — Telephone Encounter (Signed)
 Noted

## 2024-06-10 NOTE — Telephone Encounter (Signed)
 Pain management referral was placed on 05/23/2024 and he still has not heard anything about scheduling an appt.

## 2024-06-10 NOTE — Telephone Encounter (Signed)
 Spoke with patient to follow up with his telephone encounter. Patient says he is hurting really bad and has not heard from anyone in regards to his pain management referral. Patient says he is experiencing extreme pain and has had to reach our to [facility] for pain medicine. Patient says he is really in so much pain and says the Tylenol  isn't really helping him at all. Please advise.

## 2024-06-10 NOTE — Telephone Encounter (Signed)
 LMTCB in regards to the questions he had about the anemia.

## 2024-06-10 NOTE — Telephone Encounter (Signed)
 Recommend patient reach out to surgeon's office for pain medication till he is able to establish with pain clinic. Can we have referral coordinator reach out to patient/pain clinic to help with appointment?   Thank you,  Luke Shade, MD

## 2024-06-10 NOTE — Progress Notes (Signed)
 Noted

## 2024-06-13 ENCOUNTER — Other Ambulatory Visit: Payer: Self-pay | Admitting: Licensed Clinical Social Worker

## 2024-06-13 NOTE — Telephone Encounter (Signed)
 I do not prescribe opoid pain medication on postsurgical patients. Please let the patient know I recommend he reach out to his orthopedic surgeon if his pain and physical therapy referral and recommendations. [facility] has a pain management clinic as well and he can request his orthopedic surgeon to refer him to that.   Luke Shade, MD

## 2024-06-13 NOTE — Telephone Encounter (Unsigned)
 Copied from CRM [id number]. Topic: General - Other >> Jun 13, 2024  2:11 PM Paige D wrote: Reason for CRM: Pt is calling stating he can not see pain management due to them saying he needs to do physical therapy and all this other stuff but he is unable to do so as he has not been cleared pt would like a call back in regards to discuss this further.

## 2024-06-13 NOTE — Patient Instructions (Signed)
 Visit Information  Mr. Dinovo was given information about Medicaid Managed Care team care coordination services as a part of their [employer] Medicaid benefit. Carlin LITTIE Job III verbally consentedto engagement with the [facility] Managed Care team.   If you are experiencing a medical emergency, please call 911 or report to your local emergency department or urgent care.   If you have a non-emergency medical problem during routine business hours, please contact your provider's office and ask to speak with a nurse.   For questions related to your [employer] [facility] health plan, please call: [phone]  OR visit the member homepage at: [email or web address]  If you would like to schedule transportation through your [facility] [facility] plan, please call the following number at least 2 days in advance of your appointment: [phone]  If you are experiencing a behavioral health crisis, call the [employer] [place]  Behavioral Health Crisis Line at [phone]). The line is available 24 hours a day, seven days a week.  If you would like help to quit smoking, call 1-800-QUIT-NOW (930-434-2626) OR Espaol: 1-855-Djelo-Ya (8-144-664-6430) o para ms informacin haga clic aqu or Text READY to 799-599 to register via text   Mr. 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  Interventions:  Social Determinants of Health in  Patient with substance use: SDOH assessments completed: Financial Strain  and Food Insecurity  Evaluation of current treatment plan related to unmet needs Referral to [facility] Guide completed 05/29/2024 Mental Health:  Evaluation of current treatment plan related to substance use Active listening / Reflection utilized Depression screen reviewed Emotional Support Provided Motivational Interviewing employed Participation in support group encouraged : Discussed reaching back out to sponsor and starting AA again Problem Solving /Task Center strategies reviewed  Patient Goals/Self-Care Activities:  Continue taking your medication as prescribed.   Look for contact from care guide regarding SDOH resources Explore AA options and consider reaching back out to previous sponsor for additional support  06/13/2024 - Pt experessed frustration with housing and financial situation - CSW used actvie listening and provided emtional support CSW will send pt housing search from low income housing and Administrator, Civil [employer].    Plan:   Telephone follow up appointment with care management team member scheduled for:  06/19/2024        Please see education materials related to [employer] - housing/general provided by e-mail link.  Patient verbalizes understanding of instructions and care plan provided today and agrees to view in MyChart. Active MyChart status and patient understanding of how to access instructions and care plan via MyChart confirmed with patient.     Licensed Clinical Social Worker will reach back out to patient 07/08/2024  Alm Armor, LCSW [place], Population Health Licensed Clinical Social Worker Care Coordinator [phone]   Following is a copy of your plan of care:  There are no care plans that you  recently modified to display for this patient.
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
## 2024-06-14 NOTE — Progress Notes (Signed)
 Noted

## 2024-06-14 NOTE — Telephone Encounter (Signed)
 Spoke with patient to make him aware of Dr Graylon recommendations. Patient says he was told he was told that he had to see PCP to get a referral to Pain Management. Patient was informed that the is still under his surgeon's care until he is released from their facility. Patient was advised when he attends his appointment on August 7th with surgeon to mention the results of seeing his PCP, as recommended. Patient verbalized understanding and has no further questions. Patient was advised to continue with massaging the area as recommended by surgeon's office. Nathanel Moats, RN and Dr Abbey were made aware as well.

## 2024-06-14 NOTE — Telephone Encounter (Unsigned)
 Copied from CRM [id number]. Topic: Appointments - Appointment Info/Confirmation >> Jun 14, 2024  8:35 AM Rosina BIRCH wrote: Patient/patient representative is calling for information regarding an appointment.   Patient returning a call and I relayed the message to him and his insurance does not cover [facility] and he can't do physical therapy because of his leg. The patient stated he has not been released yet. Dr. Abbey was suppose to be sending a referral to [facility]. The orthopedic did prescribe medication to the patient twice but they stated he would have to have his doctor refer him to pain management. CB [phone]

## 2024-06-14 NOTE — Telephone Encounter (Signed)
 Left a message for patient to give our office a call back.   OK for E2C2 to give note if patient calls back. If relayed, please notify the office.

## 2024-06-14 NOTE — Telephone Encounter (Signed)
 Patient was referred to pain clinic at [facility]. Pain physician reviewed his medical records and has the following recommendations:  Suggest patient see Orthopedic and go thru physical therapy first before coming in for consultation.  At this point, my recommendation is patient follow up with his orthopedic surgery for pain management, physical therapy. Once he is stable then only [facility] will be able to see the patient.   Thank you,  Luke Shade, MD

## 2024-06-18 ENCOUNTER — Other Ambulatory Visit: Payer: Self-pay

## 2024-06-18 DIAGNOSIS — M130 Polyarthritis, unspecified: Secondary | ICD-10-CM

## 2024-06-18 MED ORDER — GABAPENTIN 300 MG PO CAPS: 300.0000 mg | ORAL_CAPSULE | Freq: Three times a day (TID) | ORAL | 1 refills | Status: DC

## 2024-06-18 NOTE — Telephone Encounter (Signed)
 Sent to provider in a Refill Encounter

## 2024-06-18 NOTE — Telephone Encounter (Unsigned)
 Copied from CRM [id number]. Topic: General - Call Back - No Documentation >> Jun 18, 2024  9:39 AM Berneda FALCON wrote: Reason for CRM: Patient would like Destiny to please call him back. He had a missed call from her and would like to talk to her please.  Patient callback is [phone]

## 2024-06-18 NOTE — Telephone Encounter (Signed)
 1. Polyarthritis (Primary) - gabapentin  (NEURONTIN ) 300 MG capsule; Take 1 capsule (300 mg total) by mouth 3 (three) times daily.  Dispense: 270 capsule; Refill: 1  Gabapentin  600 mg, three times a day is too high of a dose. I recommend he reduce this to 300 mg, one capsule three times a day. I have sent in refill.   I am not able to send him refill on Fioricet. I am happy to refer him to neurology for evaluation of migraine headache. He is already on multiple medications that can cause rebound headache. He can take over the counter Excedrin 2 tablets at onset of headache, do not exceed more than 2 tablets in 24 hours.   Thank you,  Luke Shade, MD

## 2024-06-18 NOTE — Telephone Encounter (Signed)
 Spoke with patient and he is asking if Dr Abbey can refill this medication for him until he gets in with Pain Management. Patient says he was told by the provider during his office visit that if he can't get in then she would fill the medication for him. Please advise?

## 2024-06-21 ENCOUNTER — Telehealth: Payer: Self-pay

## 2024-06-21 NOTE — Patient Instructions (Signed)
 Carlin LITTIE Buttner III - I am sorry I was unable to reach you today for our scheduled appointment. I work with Bair, Kalpana, MD and am calling to support your healthcare needs. Please contact me at [phone] to reschedule at your earliest convenience. I look forward to speaking with you soon.   Thank you,  Nestora Duos, MSN, RN [facility]  [facility], [facility] RN Care Manager Direct Dial: [phone] Fax: [phone]

## 2024-06-25 ENCOUNTER — Other Ambulatory Visit

## 2024-06-27 ENCOUNTER — Other Ambulatory Visit (INDEPENDENT_AMBULATORY_CARE_PROVIDER_SITE_OTHER)

## 2024-06-27 DIAGNOSIS — R748 Abnormal levels of other serum enzymes: Secondary | ICD-10-CM | POA: Diagnosis not present

## 2024-06-27 DIAGNOSIS — D539 Nutritional anemia, unspecified: Secondary | ICD-10-CM | POA: Diagnosis not present

## 2024-06-27 LAB — CBC WITH DIFFERENTIAL/PLATELET
Basophils Absolute: 0.1 K/uL (ref 0.0–0.1)
Basophils Relative: 1.4 % (ref 0.0–3.0)
Eosinophils Absolute: 0.2 K/uL (ref 0.0–0.7)
Eosinophils Relative: 4.8 % (ref 0.0–5.0)
HCT: 38.8 % — ABNORMAL LOW (ref 39.0–52.0)
Hemoglobin: 12.9 g/dL — ABNORMAL LOW (ref 13.0–17.0)
Lymphocytes Relative: 33.9 % (ref 12.0–46.0)
Lymphs Abs: 1.5 K/uL (ref 0.7–4.0)
MCHC: 33.2 g/dL (ref 30.0–36.0)
MCV: 87.6 fl (ref 78.0–100.0)
Monocytes Absolute: 0.4 K/uL (ref 0.1–1.0)
Monocytes Relative: 8.7 % (ref 3.0–12.0)
Neutro Abs: 2.2 K/uL (ref 1.4–7.7)
Neutrophils Relative %: 51.2 % (ref 43.0–77.0)
Platelets: 115 K/uL — ABNORMAL LOW (ref 150.0–400.0)
RBC: 4.43 Mil/uL (ref 4.22–5.81)
RDW: 15.5 % (ref 11.5–15.5)
WBC: 4.3 K/uL (ref 4.0–10.5)

## 2024-06-28 ENCOUNTER — Ambulatory Visit: Payer: Self-pay

## 2024-06-28 LAB — HEPATIC FUNCTION PANEL
ALT: 20 U/L (ref 0–53)
AST: 30 U/L (ref 0–37)
Albumin: 4.3 g/dL (ref 3.5–5.2)
Alkaline Phosphatase: 101 U/L (ref 39–117)
Bilirubin, Direct: 0.1 mg/dL (ref 0.0–0.3)
Total Bilirubin: 0.4 mg/dL (ref 0.2–1.2)
Total Protein: 7.5 g/dL (ref 6.0–8.3)

## 2024-06-28 NOTE — Progress Notes (Signed)
 Please let the patient know I reviewed his lab results. CBC shows improved hemoglobin. He does have mildly low Platelets which is likely related to liver cirrhosis. I will continue to monitor this with repeat lab periodically.  His  liver function looks improved compared to 06/27/24. If he has questions or concerns recommend schedule an appointment to discuss.   Thank you,  Luke Shade, MD

## 2024-07-08 ENCOUNTER — Telehealth: Payer: Self-pay | Admitting: Licensed Clinical Social Worker

## 2024-07-08 ENCOUNTER — Encounter: Payer: Self-pay | Admitting: Licensed Clinical Social Worker

## 2024-07-17 ENCOUNTER — Ambulatory Visit: Admitting: Pain Medicine

## 2024-07-25 ENCOUNTER — Telehealth: Payer: Self-pay

## 2024-07-25 NOTE — Patient Outreach (Signed)
 Complex Care Management Care Guide Note  07/25/2024 Name: TAM DELISLE III MRN: [id number] DOB: 06/19/1971  Carlin LITTIE Buttner III is a 53 y.o. year old male who is a primary care patient of Bair, Kalpana, MD and is actively engaged with the care management team. I reached out to Carlin LITTIE Buttner III by phone today to assist with re-scheduling  with the RN Case Manager.  Follow up plan: Telephone appointment with complex care management team member scheduled for:  08/09/24  Shereen Gin [facility]  Population Health VBCI Assistant Direct Dial: [phone]  Fax: [phone] Website: [email or web address]

## 2024-07-26 ENCOUNTER — Ambulatory Visit: Payer: Self-pay

## 2024-07-26 ENCOUNTER — Other Ambulatory Visit: Payer: Self-pay

## 2024-07-26 DIAGNOSIS — R519 Headache, unspecified: Secondary | ICD-10-CM

## 2024-07-26 NOTE — Telephone Encounter (Unsigned)
 Copied from CRM #[id number]. Topic: Clinical - Medication Refill >> Jul 26, 2024 12:04 PM Jayma L wrote: Medication: butalbital-acetaminophen -caffeine (FIORICET) 50-325-40 MG tablet  Has the patient contacted their pharmacy? Yes (Agent: If no, request that the patient contact the pharmacy for the refill. If patient does not wish to contact the pharmacy document the reason why and proceed with request.) (Agent: If yes, when and what did the pharmacy advise?)  This is the patient's preferred pharmacy:  [facility] [address] [place] - [address] [place] [address] Phone: [phone] Fax: [phone]  Is this the correct pharmacy for this prescription? Yes If no, delete pharmacy and type the correct one.   Has the prescription been filled recently? No  Is the patient out of the medication? No  Has the patient been seen for an appointment in the last year OR does the patient have an upcoming appointment? Yes  Can we respond through MyChart? Yes  Agent: Please be advised that Rx refills may take up to 3 business days. We ask that you follow-up with your pharmacy.
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
## 2024-07-28 MED ORDER — BUTALBITAL-APAP-CAFFEINE 50-325-40 MG PO TABS: 1.0000 | ORAL_TABLET | Freq: Two times a day (BID) | ORAL | 1 refills | Status: DC | PRN

## 2024-07-28 NOTE — Telephone Encounter (Signed)
 1. Chronic nonintractable headache, unspecified headache type (Primary) - butalbital -acetaminophen -caffeine  (FIORICET) 50-325-40 MG tablet; Take 1 tablet by mouth every 12 (twelve) hours as needed for migraine (No more than 3 days per month).  Dispense: 12 tablet; Refill: 1   Imari Reen clinical: Please let the patient know I have sent 12 tablets of Fioricet, take one tablet, twice a day as needed for headache. Limit it to no more than 3 days per 30 days as this medication has Acetaminophen  in it. Patient is already on Norco which also has acetaminophen . He has a h/o alcoholic liver cirrhosis and I strongly recommend limiting acetaminophen  intake to no more than 2,000 mg per day maximum to limit further liver damage.   Thank you,  Luke Shade, MD

## 2024-08-06 ENCOUNTER — Other Ambulatory Visit: Payer: Self-pay

## 2024-08-06 ENCOUNTER — Ambulatory Visit: Payer: Self-pay

## 2024-08-06 ENCOUNTER — Ambulatory Visit

## 2024-08-06 VITALS — BP 120/80 | HR 115 | Temp 99.6°F | Ht 67.0 in | Wt 184.2 lb

## 2024-08-06 DIAGNOSIS — M130 Polyarthritis, unspecified: Secondary | ICD-10-CM | POA: Diagnosis not present

## 2024-08-06 DIAGNOSIS — J301 Allergic rhinitis due to pollen: Secondary | ICD-10-CM

## 2024-08-06 DIAGNOSIS — F5101 Primary insomnia: Secondary | ICD-10-CM | POA: Diagnosis not present

## 2024-08-06 DIAGNOSIS — R2232 Localized swelling, mass and lump, left upper limb: Secondary | ICD-10-CM

## 2024-08-06 DIAGNOSIS — G43E09 Chronic migraine with aura, not intractable, without status migrainosus: Secondary | ICD-10-CM | POA: Diagnosis not present

## 2024-08-06 LAB — SEDIMENTATION RATE: Sed Rate: 19 mm/h (ref 0–20)

## 2024-08-06 MED ORDER — FLUTICASONE PROPIONATE 50 MCG/ACT NA SUSP
2.0000 | Freq: Every day | NASAL | 2 refills | Status: AC
  Filled 2024-08-06: qty 16, 30d supply, fill #0

## 2024-08-06 MED ORDER — DICLOFENAC SODIUM 1 % EX GEL
4.0000 g | Freq: Four times a day (QID) | CUTANEOUS | 4 refills | Status: AC
  Filled 2024-08-06: qty 100, 6d supply, fill #0

## 2024-08-06 MED ORDER — NURTEC 75 MG PO TBDP
75.0000 mg | ORAL_TABLET | Freq: Every day | ORAL | 2 refills | Status: AC | PRN
  Filled 2024-08-06: qty 16, 30d supply, fill #0

## 2024-08-06 MED ORDER — MELATONIN 3 MG PO TABS
3.0000 mg | ORAL_TABLET | Freq: Every day | ORAL | 1 refills | Status: AC
  Filled 2024-08-06: qty 90, 90d supply, fill #0

## 2024-08-06 NOTE — Assessment & Plan Note (Signed)
 Continue Trazodone  100 mg, improve sleep hygiene discussed. Trial of Melatonin 3 mg daily an hour before bedtime. Medication sent to the pharmacy.

## 2024-08-06 NOTE — Assessment & Plan Note (Signed)
 Trial of Nurtec 75 mg, sublingual at migraine onset; do not exceed 75 mg in 24 hours or 18 doses in 30 days. Hold off Fioricet at the meantime. If headache does not get better or worsen with Nurtec consider neurology referral.

## 2024-08-06 NOTE — Assessment & Plan Note (Signed)
 Concern for Dupuytern's disease, patient was counseled on disease process, recommend PT referral, hand surgery evaluation. He declined both today. He has seen [facility] in the past for this. Recommend patient f/u if worsening symptoms or go to [facility] to previous provider for a f/u
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
## 2024-08-06 NOTE — Patient Instructions (Addendum)
--   For migraine headache take Nurtec 75 mg, sublingual at migraine onset; do not exceed 75 mg in 24 hours or 18 doses in 30 days. Please hold off on taking Fioricet at the meantime. If headache does not get better or worsen with Nurtec we will have you see a neurologist.   -- For nasal congestion:  Try netipot with saline 2-3 times a day followed by nasal flonase , 2 puffs in each nostril daily. I have sent the prescription for Flonase  to your pharmacy at [facility].   -- Polyarthritis:  Try Diclofenac  gel, 4 times a day, okay to use icy hot, heating pad when pain is worse. I am also checking for rheumatoid factor, inflammatory marker to rule out rheumatoid arthritis.   -- Please reach out to [facility] for follow up on liver cirrhosis. Also reach out to me if you change your mind on counseling.   -- Add melatonin 3 mg, an hour before med time to help assist with sleep.

## 2024-08-06 NOTE — Assessment & Plan Note (Signed)
 Involving shoulders, knee, hip. R/O RA with:  - Cyclic citrul peptide antibody, IgG (QUEST) - Sedimentation rate - Rheumatoid Factor and consider rheumatology referral if positive marker.  - Trial of - diclofenac  Sodium (VOLTAREN ) 1 % GEL; Apply 4 g topically 4 (four) times daily.  Dispense: 350 g; Refill: 4

## 2024-08-06 NOTE — Assessment & Plan Note (Signed)
 Fluticasone  2 sprays into both nostrils daily, saline nasal rinses 2-3 times a day and daily Zyrtec  10 mg. F/u if symptoms worsens.

## 2024-08-07 LAB — RHEUMATOID FACTOR: Rheumatoid fact SerPl-aCnc: <10 [IU]/mL (ref <14)

## 2024-08-07 LAB — CYCLIC CITRUL PEPTIDE ANTIBODY, IGG: Cyclic Citrullin Peptide Ab: <16 U

## 2024-08-09 ENCOUNTER — Other Ambulatory Visit: Payer: Self-pay

## 2024-08-09 DIAGNOSIS — Z5971 Insufficient health insurance coverage: Secondary | ICD-10-CM
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
## 2024-08-09 NOTE — Patient Instructions (Signed)
 Visit Information  Mr. Guthrie was given information about Medicaid Managed Care team care coordination services as a part of their [employer] Medicaid benefit.   If you would like to schedule transportation through your [facility] plan, please call the following number at least 2 days in advance of your appointment: [phone]  If you are experiencing a behavioral health crisis, call the [employer] [place]  Behavioral Health Crisis Line at [phone] The line is available 24 hours a day, seven days a week.   Please see education materials related to NONE provided by MyChart link.  Patient verbalizes understanding of instructions and care plan provided today and agrees to view in MyChart. Active MyChart status and patient understanding of how to access instructions and care plan via MyChart confirmed with patient.     Telephone follow up appointment with Managed Medicaid care management team member scheduled for:09/06/2024 at 10:00 am. BSW regarding insurance issues 08/13/2024 at 10:00 am. [facility] regarding medication cost assist - they will contact to schedule.   Nestora Duos, MSN, RN [facility], [facility] RN Care Manager Direct Dial: [phone] Fax: [phone]   Following is a copy of your plan of care:  There are no care plans that you recently modified to display for this patient.

## 2024-08-13 ENCOUNTER — Telehealth: Payer: Self-pay

## 2024-08-13 NOTE — Progress Notes (Unsigned)
 PROVIDER NOTE: Interpretation of information contained herein should be left to medically-trained personnel. Specific patient instructions are provided elsewhere under Patient Instructions section of medical record. This document was created in part using AI and STT-dictation technology, any transcriptional errors that may result from this process are unintentional.  Patient: Cameron [name]  Service: E/M Encounter  Provider: Eric DELENA Como, MD  DOB: 02/15/1971  Delivery: Face-to-face  Specialty: Interventional Pain Management  MRN: [id number]  Setting: Ambulatory outpatient facility  Specialty designation: 09  Type: New Patient  Location: Outpatient office facility  PCP: Abbey Bruckner, MD  DOS: 08/14/2024    Referring Prov.: Bair, Kalpana, MD   Primary Reason(s) for Visit: Encounter for initial evaluation of one or more chronic problems (new to examiner) potentially causing chronic pain, and posing a threat to normal musculoskeletal function. (Level of risk: High) CC: No chief complaint on file.  HPI  Cameron [name] is a 53 y.o. year old, male patient, who comes for the first time to our practice referred by Bair, Kalpana, MD for our initial evaluation of his chronic pain. He has Asthma due to environmental allergies; Depression, recurrent; Substance abuse (HCC)-Alcohol abuse; Fatigue; Joint pain; Hearing loss in left ear; Injury of ankle and foot, right, initial encounter; Essential hypertension; Elevated lipids; Elevated alkaline phosphatase level; Alcoholic cirrhosis (HCC); Chronic pain of both shoulders; Open fracture of right femur, type IIIA, IIIB, or IIIC, with nonunion; Other ascites; Polyarthralgia; Mood disorder; Polyarthritis; Macrocytic anemia; Elevated AST (SGOT); Peyronie disease; Chronic migraine with aura without status migrainosus, not intractable; Seasonal allergic rhinitis due to pollen; Primary insomnia; and Nodule of left palm on their problem list. Today he comes in for  evaluation of his No chief complaint on file.  Pain Assessment: Location:     Radiating:   Onset:   Duration:   Quality:   Severity:  /10 (subjective, self-reported pain score)  Effect on ADL:   Timing:   Modifying factors:   BP:    HR:    Onset and Duration: {Hx; Onset and Duration:210120511} Cause of pain: {Hx; Cause:210120521} Severity: {Pain Severity:210120502} Timing: {Symptoms; Timing:210120501} Aggravating Factors: {Causes; Aggravating pain factors:210120507} Alleviating Factors: {Causes; Alleviating Factors:210120500} Associated Problems: {Hx; Associated problems:210120515} Quality of Pain: {Hx; Symptom quality or Descriptor:210120531} Previous Examinations or Tests: {Hx; Previous examinations or test:210120529} Previous Treatments: {Hx; Previous Treatment:210120503}  Cameron [name] is being evaluated for possible interventional pain management therapies for the treatment of his chronic pain.  Discussed the use of AI scribe software for clinical note transcription with the patient, who gave verbal consent to proceed.  History of Present Illness            Cameron [name] has been informed that this initial visit was an evaluation only.  On the follow up appointment I will go over the results, including ordered tests and available interventional therapies. At that time he will have the opportunity to decide whether to proceed with offered therapies or not. In the event that Cameron [name] prefers avoiding interventional options, this will conclude our involvement in the case.  Medication management recommendations may be provided upon request.  Patient informed that diagnostic tests may be ordered to assist in identifying underlying causes, narrow the list of differential diagnoses and aid in determining candidacy for (or contraindications to) planned therapeutic interventions.  Historic Controlled Substance Pharmacotherapy Review PMP and historical list of controlled substances:  ***  Most recently prescribed controlled substance(s): Opioid Analgesic: *** MME/day: *** mg/day  Historical Monitoring: The patient  reports that he does not currently use drugs after having used the following drugs: Cocaine. List of prior UDS Testing: No results found for: MDMA, COCAINSCRNUR, PCPSCRNUR, PCPQUANT, CANNABQUANT, THCU, ETH, CBDTHCR, D8THCCBX, D9THCCBX Historical Background Evaluation: [place] PMP: PDMP reviewed during this encounter. Review of the past 43-months conducted.             PMP NARX Score Report:  Narcotic: 280 Sedative: 230 Stimulant: 000 [place] Department of public safety, offender search: Engineer, mining Information) Non-contributory Risk Assessment Profile: Aberrant behavior: None observed or detected today Risk factors for fatal opioid overdose: None identified today PMP NARX Overdose Risk Score: 440 Fatal overdose hazard ratio (HR): Calculation deferred Non-fatal overdose hazard ratio (HR): Calculation deferred Risk of opioid abuse or dependence: 0.7-3.0% with doses <= 36 MME/day and 6.1-26% with doses >= 120 MME/day. Substance use disorder (SUD) risk level: See below Personal History of Substance Abuse (SUD-Substance use disorder):  Alcohol:    Illegal Drugs:    Rx Drugs:    ORT Risk Level calculation:    ORT Scoring interpretation table:  Score <3 = Low Risk for SUD  Score between 4-7 = Moderate Risk for SUD  Score >8 = High Risk for Opioid Abuse   PHQ-2 Depression Scale:  Total score:    PHQ-2 Scoring interpretation table: (Score and probability of major depressive disorder)  Score 0 = No depression  Score 1 = 15.4% Probability  Score 2 = 21.1% Probability  Score 3 = 38.4% Probability  Score 4 = 45.5% Probability  Score 5 = 56.4% Probability  Score 6 = 78.6% Probability   PHQ-9 Depression Scale:  Total score:    PHQ-9 Scoring interpretation table:  Score 0-4 = No depression  Score 5-9 = Mild depression  Score 10-14 = Moderate  depression  Score 15-19 = Moderately severe depression  Score 20-27 = Severe depression (2.4 times higher risk of SUD and 2.89 times higher risk of overuse)   Pharmacologic Plan: As per protocol, I have not taken over any controlled substance management, pending the results of ordered tests and/or consults.            Initial impression: Pending review of available data and ordered tests.  Meds   Current Outpatient Medications:    acamprosate (CAMPRAL) 333 MG tablet, Take 333 mg by mouth 3 (three) times daily., Disp: , Rfl:    albuterol  (VENTOLIN  HFA) 108 (90 Base) MCG/ACT inhaler, Inhale 2 puffs into the lungs every 6 (six) hours as needed for wheezing or shortness of breath., Disp: 8 g, Rfl: 2   atorvastatin  (LIPITOR) 40 MG tablet, Take 1 tablet (40 mg total) by mouth daily., Disp: 90 tablet, Rfl: 3   calcium  carbonate (OSCAL) 1500 (600 Ca) MG TABS tablet, Take by mouth., Disp: , Rfl:    cetirizine  (ZYRTEC ) 10 MG tablet, Take 1 tablet (10 mg total) by mouth daily., Disp: 60 tablet, Rfl: 0   diclofenac  Sodium (VOLTAREN ) 1 % GEL, Apply 4 g topically 4 (four) times daily. (Patient not taking: Reported on 08/09/2024), Disp: 350 g, Rfl: 4   enoxaparin (LOVENOX) 40 MG/0.4ML injection, Inject into the skin. (Patient not taking: Reported on 08/09/2024), Disp: , Rfl:    fluticasone  (FLONASE ) 50 MCG/ACT nasal spray, Place 2 sprays into both nostrils daily. (Patient not taking: Reported on 08/09/2024), Disp: 16 g, Rfl: 2   furosemide  (LASIX ) 40 MG tablet, Take 1 tablet (40 mg total) by mouth daily., Disp: 90 tablet, Rfl: 0   gabapentin  (NEURONTIN ) 300 MG  capsule, Take 900 mg by mouth 3 (three) times daily., Disp: , Rfl:    hydrOXYzine  (ATARAX ) 25 MG tablet, Take 1 tablet (25 mg total) by mouth daily as needed., Disp: 90 tablet, Rfl: 0   melatonin 3 MG TABS tablet, Take 1 tablet (3 mg total) by mouth at bedtime. (Patient not taking: Reported on 08/09/2024), Disp: 90 tablet, Rfl: 1   methocarbamol (ROBAXIN)  500 MG tablet, Take 500 mg by mouth 3 (three) times daily., Disp: , Rfl:    Multiple Vitamin (QUINTABS) TABS, Take 1 tablet by mouth daily., Disp: , Rfl:    polyethylene glycol powder (GLYCOLAX/MIRALAX) 17 GM/SCOOP powder, Take 17 g by mouth daily. (Patient taking differently: Take 17 g by mouth daily. PRN), Disp: , Rfl:    Rimegepant Sulfate  (NURTEC) 75 MG TBDP, Take 1 tablet (75 mg total) by mouth daily as needed. (Patient not taking: Reported on 08/09/2024), Disp: 17 tablet, Rfl: 2   sertraline  (ZOLOFT ) 100 MG tablet, Take 1 tablet (100 mg total) by mouth daily., Disp: 90 tablet, Rfl: 3   traZODone  (DESYREL ) 100 MG tablet, Take 1 tablet (100 mg total) by mouth once nightly at bedtime., Disp: 90 tablet, Rfl: 0   Vitamin D , Ergocalciferol , (DRISDOL ) 1.25 MG (50000 UNIT) CAPS capsule, Take 1 capsule (50,000 Units total) by mouth every 7 (seven) days., Disp: 12 capsule, Rfl: 0  Imaging Review  Shoulder Imaging: Shoulder-R MR wo contrast: Results for orders placed during the hospital encounter of 01/20/18 MR SHOULDER RIGHT WO CONTRAST  Narrative CLINICAL DATA:  Right shoulder pain and limited range of motion for 18 months. No known injury.  EXAM: MRI OF THE RIGHT SHOULDER WITHOUT CONTRAST  TECHNIQUE: Multiplanar, multisequence MR imaging of the shoulder was performed. No intravenous contrast was administered.  COMPARISON:  None.  FINDINGS: Rotator cuff: Thickening and heterogeneously increased T2 signal in the rotator cuff tendons consistent with tendinopathy is identified. No tear.  Muscles:  Normal without atrophy or focal lesion.  Biceps long head:  Intact and normal appearance.  Acromioclavicular Joint: Mild to moderate degenerative change is seen. Type 1 acromion. Fluid and debris are present in the subacromial/subdeltoid bursa consistent with bursitis.  Glenohumeral Joint: Appears normal.  Labrum:  Intact.  Bones:  No fracture or worrisome lesion.  Other:  None.  IMPRESSION: Rotator cuff tendinopathy without tear.  Fluid and debris in the subacromial/subdeltoid bursa consistent with bursitis.  Mild to moderate acromioclavicular osteoarthritis.   Electronically Signed By: Debby Prader M.D. On: 01/21/2018 01:28  Knee Imaging: Knee-R DG 4 views: Results for orders placed during the hospital encounter of 04/30/24 DG Knee Complete 4 Views Right  Narrative CLINICAL DATA:  Pain and swelling  EXAM: RIGHT KNEE - COMPLETE 4 VIEW  COMPARISON:  None Available.  FINDINGS: Osteopenia. There is intramedullary rod along the femur incompletely include the imaging field. Several fixation screws along the distal aspect of the rod. There is significant lucency along the more proximal of the several screws. This transfixes the severely comminuted distal femoral metaphyseal fracture. Several areas of fracture fragments are incompletely unionized. Margins are sclerotic favor a more chronic process. However the distal aspect of the intra measure rod is fractured with significant displacement of the main distal fracture fragment dorsal on the lateral view compared to the proximal fragment. The tip of the proximal fragment appears to be outside the bone on the lateral view as well.  Preserved mediolateral compartment joint spaces. There is joint space loss along the patellofemoral joint with  hyperostosis.  IMPRESSION: ORIF of the distal femur with several screws and intramedullary rod. The rod is fractured at the level of the femoral metaphysis and is significantly displaced as above. Surrounding incompletely unionized comminuted fracture fragments are noted. Please correlate with any prior imaging to assess the chronicity of this appearance. The hardware fracture could be acute.  There is some lucency surrounding the most proximal distal fixation screw along the intramedullary rod. Please correlate for any signs of infection or  loosening. Again comparison to prior would be useful.  Degenerative changes of the patellofemoral joint.  Osteopenia.   Electronically Signed By: Ranell Bring M.D. On: 04/30/2024 14:51  Ankle Imaging: Ankle-R DG Complete: Results for orders placed during the hospital encounter of 12/04/19 DG Ankle Complete Right  Narrative CLINICAL DATA:  Twisted ankle falling into pothole  EXAM: RIGHT ANKLE - COMPLETE 3+ VIEW  COMPARISON:  None.  FINDINGS: Obliquely oriented, mildly comminuted fracture trans syndesmotic fracture of the distal fibula (Weber B). Additional osseous defect at the tip of the medial malleolus may reflect additional avulsion. There is slight lateral talar shift. Circumferential soft tissue swelling of the ankle is present with a moderate effusion.  IMPRESSION: 1. Obliquely oriented, mildly comminuted trans syndesmotic fracture of the distal fibula with slight lateral talar shift. 2. Osseous defect at the tip of the medial malleolus may reflect additional avulsion. 3. Combination of features are compatible with a Weber B stage IV supination external rotation injury. 4. Associated swelling and ankle effusion.   Electronically Signed By: Gretel Edis M.D. On: 12/04/2019 17:52  Complexity Note: Imaging results reviewed.                         ROS  Cardiovascular: {Hx; Cardiovascular History:210120525} Pulmonary or Respiratory: {Hx; Pumonary and/or Respiratory History:210120523} Neurological: {Hx; Neurological:210120504} Psychological-Psychiatric: {Hx; Psychological-Psychiatric History:210120512} Gastrointestinal: {Hx; Gastrointestinal:210120527} Genitourinary: {Hx; Genitourinary:210120506} Hematological: {Hx; Hematological:210120510} Endocrine: {Hx; Endocrine history:210120509} Rheumatologic: {Hx; Rheumatological:210120530} Musculoskeletal: {Hx; Musculoskeletal:210120528} Work History: {Hx; Work history:210120514}  Allergies  Cameron [name] has no known  allergies.  Laboratory Chemistry Profile   Renal Lab Results  Component Value Date   BUN 7 05/23/2024   CREATININE 0.84 05/23/2024   BCR 8 (L) 05/23/2024   GFRAA 93 01/03/2019   GFRNONAA >60 04/30/2024   SPECGRAV 1.015 07/11/2023   PHUR 5.5 07/11/2023   PROTEINUR Negative 07/11/2023     Electrolytes Lab Results  Component Value Date   NA 138 05/23/2024   K 5.2 05/23/2024   CL 98 05/23/2024   CALCIUM  9.6 05/23/2024   MG 2.2 01/03/2019   PHOS 3.6 01/03/2019     Hepatic Lab Results  Component Value Date   AST 30 06/27/2024   ALT 20 06/27/2024   ALBUMIN 4.3 06/27/2024   ALKPHOS 101 06/27/2024     ID No results found for: LYMEIGGIGMAB, HIV, SARSCOV2NAA, STAPHAUREUS, MRSAPCR, HCVAB, PREGTESTUR, RMSFIGG, QFVRPH1IGG, QFVRPH2IGG   Bone Lab Results  Component Value Date   VD25OH 14.9 (L) 05/23/2024   TESTOSTERONE 877 07/14/2021     Endocrine Lab Results  Component Value Date   GLUCOSE 91 05/23/2024   GLUCOSEU Negative 07/11/2023   HGBA1C 5.2 07/14/2021   TSH 4.240 07/14/2021   TESTOSTERONE 877 07/14/2021     Neuropathy Lab Results  Component Value Date   VITAMINB12 558 05/23/2024   FOLATE 10.8 12/08/2021   HGBA1C 5.2 07/14/2021     CNS No results found for: COLORCSF, APPEARCSF, RBCCOUNTCSF, WBCCSF, POLYSCSF, LYMPHSCSF, EOSCSF, PROTEINCSF, GLUCCSF,  JCVIRUS, CSFOLI, IGGCSF, LABACHR, ACETBL   Inflammation (CRP: Acute  ESR: Chronic) Lab Results  Component Value Date   CRP 1.3 (H) 04/30/2024   ESRSEDRATE 19 08/06/2024     Rheumatology Lab Results  Component Value Date   RF <10 08/06/2024     Coagulation Lab Results  Component Value Date   PLT 115.0 (L) 06/27/2024     Cardiovascular Lab Results  Component Value Date   HGB 12.9 (L) 06/27/2024   HCT 38.8 (L) 06/27/2024     Screening No results found for: SARSCOV2NAA, COVIDSOURCE, STAPHAUREUS, MRSAPCR, HCVAB, HIV, PREGTESTUR    Cancer No results found for: CEA, CA125, LABCA2   Allergens No results found for: ALMOND, APPLE, ASPARAGUS, AVOCADO, BANANA, BARLEY, BASIL, BAYLEAF, GREENBEAN, LIMABEAN, WHITEBEAN, BEEFIGE, REDBEET, BLUEBERRY, BROCCOLI, CABBAGE, MELON, CARROT, CASEIN, CASHEWNUT, CAULIFLOWER, CELERY     Note: Lab results reviewed.  PFSH  Drug: Cameron [name]  reports that he does not currently use drugs after having used the following drugs: Cocaine. Alcohol:  reports current alcohol use of about 14.0 standard drinks of alcohol per week. Tobacco:  reports that he has never smoked. His smokeless tobacco use includes chew. Medical:  has a past medical history of Allergy, Asthma, Depression, Hypertension, Migraines, Otitis media of left ear (09/11/2019), Subdural hematoma (HCC), Substance abuse (HCC), and Tooth decay (07/14/2021). Family: family history includes Aneurysm in his mother; CAD in his father; Cancer in his maternal grandfather; Diabetes in his father; Healthy in his sister; Heart attack in his maternal grandmother and paternal grandfather; Hyperlipidemia in his mother; Hypertension in his mother; Other in his paternal grandmother; Stroke in his mother.  Past Surgical History:  Procedure Laterality Date   LEG SURGERY Right    Active Ambulatory Problems    Diagnosis Date Noted   Asthma due to environmental allergies 01/03/2019   Depression, recurrent 01/03/2019   Substance abuse (HCC)-Alcohol abuse 01/03/2019   Fatigue 05/02/2019   Joint pain 05/02/2019   Hearing loss in left ear 11/21/2019   Injury of ankle and foot, right, initial encounter 12/04/2019   Essential hypertension 10/26/2020   Elevated lipids 07/29/2021   Elevated alkaline phosphatase level 07/29/2021   Alcoholic cirrhosis (HCC) 03/17/2023   Chronic pain of both shoulders 12/18/2017   Open fracture of right femur, type IIIA, IIIB, or IIIC, with nonunion 05/02/2024   Other ascites  02/28/2023   Polyarthralgia 02/01/2018   Mood disorder 05/23/2024   Polyarthritis 05/23/2024   Macrocytic anemia 05/23/2024   Elevated AST (SGOT) 05/23/2024   Peyronie disease 05/23/2024   Chronic migraine with aura without status migrainosus, not intractable 08/06/2024   Seasonal allergic rhinitis due to pollen 08/06/2024   Primary insomnia 08/06/2024   Nodule of left palm 08/06/2024   Resolved Ambulatory Problems    Diagnosis Date Noted   Alcohol abuse 01/03/2019   Otitis media of left ear 09/11/2019   Tachycardia 10/26/2020   Tooth decay 07/14/2021   Past Medical History:  Diagnosis Date   Allergy    Asthma    Depression    Hypertension    Migraines    Subdural hematoma (HCC)    Constitutional Exam  General appearance: Well nourished, well developed, and well hydrated. In no apparent acute distress There were no vitals filed for this visit. BMI Assessment: Estimated body mass index is 28.85 kg/m as calculated from the following:   Height as of 08/06/24: 5' 7 (1.702 m).   Weight as of 08/06/24: 184 lb 3.2 oz (83.6 kg).  BMI  interpretation table: BMI level Category Range association with higher incidence of chronic pain  <18 kg/m2 Underweight   18.5-24.9 kg/m2 Ideal body weight   25-29.9 kg/m2 Overweight Increased incidence by 20%  30-34.9 kg/m2 Obese (Class I) Increased incidence by 68%  35-39.9 kg/m2 Severe obesity (Class II) Increased incidence by 136%  >40 kg/m2 Extreme obesity (Class [name]) Increased incidence by 254%   Patient's current BMI Ideal Body weight  There is no height or weight on file to calculate BMI. Ideal body weight: 66.1 kg (145 lb 11.6 oz) Adjusted ideal body weight: 73.1 kg (161 lb 1.8 oz)   BMI Readings from Last 4 Encounters:  08/06/24 28.85 kg/m  05/23/24 26.63 kg/m  04/30/24 27.41 kg/m  07/11/23 26.63 kg/m   Wt Readings from Last 4 Encounters:  08/06/24 184 lb 3.2 oz (83.6 kg)  05/23/24 170 lb (77.1 kg)  04/30/24 175 lb (79.4 kg)   07/11/23 170 lb (77.1 kg)    Psych/Mental status: Alert, oriented x 3 (person, place, & time)       Eyes: PERLA Respiratory: No evidence of acute respiratory distress  Assessment  Primary Diagnosis & Pertinent Problem List: There were no encounter diagnoses.  Visit Diagnosis (New problems to examiner): No diagnosis found. Plan of Care (Initial workup plan)  Note: Cameron [name] was reminded that as per protocol, today's visit has been an evaluation only. We have not taken over the patient's controlled substance management.  Problem-specific plan: Assessment and Plan            Lab Orders  No laboratory test(s) ordered today   Imaging Orders  No imaging studies ordered today   Referral Orders  No referral(s) requested today   Procedure Orders    No procedure(s) ordered today   Pharmacotherapy (current): Medications ordered:  No orders of the defined types were placed in this encounter.  Medications administered during this visit: Avian L. Bracher [name] had no medications administered during this visit.   Analgesic Pharmacotherapy:  Opioid Analgesics: For patients currently taking or requesting to take opioid analgesics, in accordance with [place]  Medical Board Guidelines, we will assess their risks and indications for the use of these substances. After completing our evaluation, we may offer recommendations, but we no longer take patients for medication management. The prescribing physician will ultimately decide, based on his/her training and level of comfort whether to adopt any of the recommendations, including whether or not to prescribe such medicines.  Membrane stabilizer: To be determined at a later time  Muscle relaxant: To be determined at a later time  NSAID: To be determined at a later time  Other analgesic(s): To be determined at a later time   Interventional management options: Cameron [name] was informed that there is no guarantee that he would be a  candidate for interventional therapies. The decision will be based on the results of diagnostic studies, as well as Cameron [name]'s risk profile.  Procedure(s) under consideration:  Pending results of ordered studies     Interventional Therapies  Risk Factors  Considerations  Medical Comorbidities:     Planned  Pending:      Under consideration:   Pending   Completed: (Analgesic benefit)1  None at this time   Therapeutic  Palliative (PRN) options:   None established   Completed by other providers:   None reported  1(Analgesic benefit): Expressed in percentage (%). (Local anesthetic[LA] +/- sedation  L.A.Local Anesthetic  Steroid benefit  Ongoing benefit)   Provider-requested follow-up: No  follow-ups on file.  Future Appointments  Date Time Provider Department Center  08/14/2024 11:00 AM Tanya Glisson, MD [facility] None  08/27/2024 11:00 AM Kit Alm LABOR, LCSW [facility] None  09/06/2024 10:00 AM Devra Lands, RN [facility] None  11/06/2024  3:00 PM Bair, Luke, MD [id number]   I discussed the assessment and treatment plan with the patient. The patient was provided an opportunity to ask questions and all were answered. The patient agreed with the plan and demonstrated an understanding of the instructions.  Patient advised to call back or seek an in-person evaluation if the symptoms or condition worsens.  Duration of encounter: *** minutes.  Total time on encounter, as per AMA guidelines included both the face-to-face and non-face-to-face time personally spent by the physician and/or other qualified health care professional(s) on the day of the encounter (includes time in activities that require the physician or other qualified health care professional and does not include time in activities normally performed by clinical staff). Physician's time may include the following activities when performed: Preparing to see the patient (e.g., pre-charting review of  records, searching for previously ordered imaging, lab work, and nerve conduction tests) Review of prior analgesic pharmacotherapies. Reviewing PMP Interpreting ordered tests (e.g., lab work, imaging, nerve conduction tests) Performing post-procedure evaluations, including interpretation of diagnostic procedures Obtaining and/or reviewing separately obtained history Performing a medically appropriate examination and/or evaluation Counseling and educating the patient/family/caregiver Ordering medications, tests, or procedures Referring and communicating with other health care professionals (when not separately reported) Documenting clinical information in the electronic or other health record Independently interpreting results (not separately reported) and communicating results to the patient/ family/caregiver Care coordination (not separately reported)  Note by: Glisson LABOR Tanya, MD (TTS and AI technology used. I apologize for any typographical errors that were not detected and corrected.) Date: 08/14/2024; Time: 11:59 AM

## 2024-08-13 NOTE — Patient Instructions (Signed)

## 2024-08-14 ENCOUNTER — Ambulatory Visit (HOSPITAL_BASED_OUTPATIENT_CLINIC_OR_DEPARTMENT_OTHER): Admitting: Pain Medicine

## 2024-08-14 DIAGNOSIS — Z91199 Patient's noncompliance with other medical treatment and regimen due to unspecified reason: Secondary | ICD-10-CM

## 2024-08-14 DIAGNOSIS — E559 Vitamin D deficiency, unspecified: Secondary | ICD-10-CM | POA: Insufficient documentation

## 2024-08-14 DIAGNOSIS — R7982 Elevated C-reactive protein (CRP): Secondary | ICD-10-CM | POA: Insufficient documentation

## 2024-08-14 DIAGNOSIS — D696 Thrombocytopenia, unspecified: Secondary | ICD-10-CM | POA: Insufficient documentation

## 2024-08-14 DIAGNOSIS — G894 Chronic pain syndrome: Secondary | ICD-10-CM | POA: Insufficient documentation

## 2024-08-14 DIAGNOSIS — Z79899 Other long term (current) drug therapy: Secondary | ICD-10-CM | POA: Insufficient documentation

## 2024-08-14 DIAGNOSIS — Z789 Other specified health status: Secondary | ICD-10-CM | POA: Insufficient documentation

## 2024-08-14 DIAGNOSIS — M899 Disorder of bone, unspecified: Secondary | ICD-10-CM | POA: Insufficient documentation

## 2024-08-14 DIAGNOSIS — G8929 Other chronic pain: Secondary | ICD-10-CM

## 2024-08-19 ENCOUNTER — Other Ambulatory Visit: Payer: Self-pay

## 2024-08-19 ENCOUNTER — Telehealth: Payer: Self-pay

## 2024-08-19 NOTE — Progress Notes (Unsigned)
 Complex Care Management Note Care Guide Note  08/19/2024 Name: SCHNEIDER WARCHOL III MRN: [id number] DOB: February 28, 1971   Complex Care Management Outreach Attempts: An unsuccessful telephone outreach was attempted today to offer the patient information about available complex care management services.  Follow Up Plan:  Additional outreach attempts will be made to offer the patient complex care management information and services.   Encounter Outcome:  No Answer-Left Voicemail  Leotis Rase [facility], [facility] Guide  Direct Dial: [phone]  Fax [phone]

## 2024-08-20 NOTE — Progress Notes (Unsigned)
 Care Guide Pharmacy Note  08/20/2024 Name: Cameron [name] MRN: [id number] DOB: [birth year]  Referred By: Abbey Bruckner, MD Reason for referral: Complex Care Management, Call Attempt #1 (Unsuccessful initial outreach to schedule with PHARM DGLENWOOD Shaver), and Call Attempt #2 (Unsuccessful initial outreach to schedule with PHARM D- Shaver )   Cameron [name] is a 53 y.o. year old male who is a primary care patient of Bair, Kalpana, MD.  Cameron [name] was referred to the pharmacist for assistance related to: medication assistance  A second unsuccessful telephone outreach was attempted today to contact the patient who was referred to the pharmacy team for assistance with medication assistance. Additional attempts will be made to contact the patient.  Leotis Rase [facility], [facility] Guide  Direct Dial: [phone]  Fax [phone]

## 2024-08-21 ENCOUNTER — Telehealth: Payer: Self-pay | Admitting: *Deleted

## 2024-08-23 ENCOUNTER — Ambulatory Visit

## 2024-08-26 ENCOUNTER — Other Ambulatory Visit

## 2024-08-26 ENCOUNTER — Encounter: Payer: Self-pay | Admitting: Pharmacist

## 2024-08-26 NOTE — Progress Notes (Deleted)
   08/26/2024 Name: Cameron [name] MRN: [id number] DOB: 29-Jan-1971  Subjective  No chief complaint on file.   Care Team: Primary Care Provider: Bair, Kalpana, MD  Reason for visit: ?  Cameron [name] is a 53 y.o. male who presents today for a telephone visit with the pharmacist due to medication access concerns regarding insurance coverage problems. ?   Medication Access: ?  Reports that all medications are not affordable.  {LZ MAP s/o:31502}  Prescription drug coverage: YES Payor: [place] MEDICAID PREPAID HEALTH PLAN / Plan: [place] MEDICAID AMERIHEALTH CARIT[place] / Product Type: *No Product type* / .  Summary of Benefit: ***  Current Patient Assistance: {LZ MAP program list:31540}  Patient lives in a household of {NUMBERS:20191} {LZ MAP income choice:31503} via {Source of [phone]}.  Medicare LIS Eligible: {YES/NO:21197} {LZLISELIG2024:31094}   2025 Poverty Guidelines  Family Size  200% 250%  300%  400%  500%   1  $31,300 $39,125  $46,950  $62,600 $78,250  2  $42,300 $52,875 $63,450  $84,600 $105,750  3  $53,300 $66,625 $79,950 $106,600 $133,250  4  64,300 $80,375 $96,450 $128,600 $160,750  Programs BI Cares Inhalers BI Cares [place] BMS GSK Novo[employer] MedicareD: [employer]Well cholesterol grant   Assessment and Plan:   1. Medication Access {LZ MAP program list:31540} patient portion of application filled out today and uploaded to Media Tab.  Provider page placed in PCP inbox with instruction to fax to MAP program upon signature and to scan into chart.  Patient forms: {LZ MAP income:31500} Income documentation: {LZ MAP income documents:31501} Patient is not eligible for copay cards due to government insurance. Will collaborate with CPhT team to facilitate MAP assistance for *** CPhT Med Access team cc'd for future tracking/correspondences.  Upon completion of signed forms, will fax full application to program and upload  in Media Tab. {LZ MAP Fax:31505}.  Future Appointments  Date Time Provider Department Center  08/26/2024  1:00 PM [facility] PHARMACIST [facility] [address]  08/27/2024 11:00 AM Kit Alm LABOR, LCSW [facility] None  09/06/2024  2:00 PM Devra Lands, RN [facility] None  11/06/2024  3:00 PM Bair, Luke, MD [facility] [address] FABIENE Geronimo, PharmD Clinical Pharmacist [facility] [phone]

## 2024-08-26 NOTE — Progress Notes (Signed)
Attempted to contact patient for scheduled appointment for medication management. Left HIPAA compliant message for patient to return my call at their convenience.   

## 2024-08-27 ENCOUNTER — Telehealth: Payer: Self-pay | Admitting: Licensed Clinical Social Worker

## 2024-08-27 ENCOUNTER — Encounter: Payer: Self-pay | Admitting: Licensed Clinical Social Worker

## 2024-09-03 ENCOUNTER — Telehealth: Payer: Self-pay

## 2024-09-03 NOTE — Progress Notes (Unsigned)
 Complex Care Management Note Care Guide Note  09/03/2024 Name: Cameron [name] MRN: [id number] DOB: [birth year]   Complex Care Management Outreach Attempts: An unsuccessful telephone outreach was attempted today to offer the patient information about available complex care management services.  Follow Up Plan:  Additional outreach attempts will be made to offer the patient complex care management information and services.   Encounter Outcome:  No Answer  Dreama [facility]  [facility], [facility] VBCI Assistant Direct Dial: [phone]  Fax: [phone]

## 2024-09-05 NOTE — Progress Notes (Signed)
 Complex Care Management Care Guide Note  09/05/2024 Name: ONUR MORI [name] MRN: [id number] DOB: 1971-09-20  Cameron [name] is a 53 y.o. year old male who is a primary care patient of Bair, Kalpana, MD and is actively engaged with the care management team. I reached out to Cameron [name] by phone today to assist with re-scheduling  with the [facility] & BSW.  Follow up plan: Telephone appointment with complex care management team member scheduled for:  09/06/24 & 09/09/24.  Dreama [facility]  [facility], [facility] VBCI Assistant Direct Dial: [phone]  Fax: [phone]

## 2024-09-06 ENCOUNTER — Other Ambulatory Visit: Payer: Self-pay

## 2024-09-06 ENCOUNTER — Telehealth

## 2024-09-06 DIAGNOSIS — F32A Depression, unspecified: Secondary | ICD-10-CM

## 2024-09-06 NOTE — Patient Instructions (Signed)
 Visit Information  Cameron [name] was given information about Medicaid Managed Care team care coordination services as a part of their [employer] Medicaid benefit.   If you would like to schedule transportation through your [facility] plan, please call the following number at least 2 days in advance of your appointment: [phone]  If you are experiencing a behavioral health crisis, call the [employer] [place]  Behavioral Health Crisis Line at [phone]). The line is available 24 hours a day, seven days a week.    Please see education materials related to Depression provided by MyChart link.  Patient verbalizes understanding of instructions and care plan provided today and agrees to view in MyChart. Active MyChart status and patient understanding of how to access instructions and care plan via MyChart confirmed with patient.     Telephone follow up appointment with Managed Medicaid care management team member scheduled for:  Nestora Duos, MSN, RN [facility]  [facility], [facility] Health RN Care Manager Direct Dial: [phone] Fax: [phone]    Managing Depression, Adult Depression is a mental health condition that affects your thoughts, feelings, and actions. Being diagnosed with depression can bring you relief if you did not know why you have felt or behaved a certain way. It could also leave you feeling overwhelmed. Finding ways to manage your symptoms can help you feel more positive about your future. How to manage lifestyle changes Being depressed is difficult. Depression can increase the level of everyday stress. Stress can make depression symptoms worse. You may believe your symptoms cannot be managed or will never improve. However, there are many things you can try to help manage your symptoms. There is hope. Managing stress  Stress is your body's reaction to life changes and events, both  good and bad. Stress can add to your feelings of depression. Learning to manage your stress can help lessen your feelings of depression. Try some of the following approaches to reducing your stress (stress reduction techniques): Listen to music that you enjoy and that inspires you. Try using a meditation app or take a meditation class. Develop a practice that helps you connect with your spiritual self. Walk in nature, pray, or go to a place of worship. Practice deep breathing. To do this, inhale slowly through your nose. Pause at the top of your inhale for a few seconds and then exhale slowly, letting yourself relax. Repeat this three or four times. Practice yoga to help relax and work your muscles. Choose a stress reduction technique that works for you. These techniques take time and practice to develop. Set aside 5-15 minutes a day to do them. Therapists can offer training in these techniques. Do these things to help manage stress: Keep a journal. Know your limits. Set healthy boundaries for yourself and others, such as saying no when you think something is too much. Pay attention to how you react to certain situations. You may not be able to control everything, but you can change your reaction. Add humor to your life by watching funny movies or shows. Make time for activities that you enjoy and that relax you. Spend less time using electronics, especially at night before bed. The light from screens can make your brain think it is time to get up rather than go to bed.  Medicines Medicines, such as antidepressants, are often a part of treatment for depression. Talk with your pharmacist or health care provider about all the medicines, supplements, and herbal products that you  take, their possible side effects, and what medicines and other products are safe to take together. Make sure to report any side effects you may have to your health care provider. Relationships Your health care provider  may suggest family therapy, couples therapy, or individual therapy as part of your treatment. How to recognize changes Everyone responds differently to treatment for depression. As you recover from depression, you may start to: Have more interest in doing activities. Feel more hopeful. Have more energy. Eat a more regular amount of food. Have better mental focus. It is important to recognize if your depression is not getting better or is getting worse. The symptoms you had in the beginning may return, such as: Feeling tired. Eating too much or too little. Sleeping too much or too little. Feeling restless, agitated, or hopeless. Trouble focusing or making decisions. Having unexplained aches and pains. Feeling irritable, angry, or aggressive. If you or your family members notice these symptoms coming back, let your health care provider know right away. Follow these instructions at home: Activity Try to get some form of exercise each day, such as walking. Try yoga, mindfulness, or other stress reduction techniques. Participate in group activities if you are able. Lifestyle Get enough sleep. Cut down on or stop using caffeine , tobacco, alcohol, and any other harmful substances. Eat a healthy diet that includes plenty of vegetables, fruits, whole grains, low-fat dairy products, and lean protein. Limit foods that are high in solid fats, added sugar, or salt (sodium). General instructions Take over-the-counter and prescription medicines only as told by your health care provider. Keep all follow-up visits. It is important for your health care provider to check on your mood, behavior, and medicines. Your health care provider may need to make changes to your treatment. Where to find support Talking to others  Friends and family members can be sources of support and guidance. Talk to trusted friends or family members about your condition. Explain your symptoms and let them know that you are  working with a health care provider to treat your depression. Tell friends and family how they can help. Finances Find mental health providers that fit with your financial situation. Talk with your health care provider if you are worried about access to food, housing, or medicine. Call your insurance company to learn about your co-pays and prescription plan. Where to find more information You can find support in your area from: Anxiety and Depression Association of America (ADAA): adaa.org Mental Health America: mentalhealthamerica.net The First American on Mental Illness: nami.org Contact a health care provider if: You stop taking your antidepressant medicines, and you have any of these symptoms: Nausea. Headache. Light-headedness. Chills and body aches. Not being able to sleep (insomnia). You or your friends and family think your depression is getting worse. Get help right away if: You have thoughts of hurting yourself or others. Get help right away if you feel like you may hurt yourself or others, or have thoughts about taking your own life. Go to your nearest emergency room or: Call 911. Call the National Suicide Prevention Lifeline at 224-772-4617 or 988. This is open 24 hours a day. Text the Crisis Text Line at 414-447-1756. This information is not intended to replace advice given to you by your health care provider. Make sure you discuss any questions you have with your health care provider. Document Revised: 03/15/2022 Document Reviewed: 03/15/2022 Elsevier Patient Education  2024 [employer].  Following is a copy of your plan of care:  There are  no care plans that you recently modified to display for this patient.

## 2024-09-06 NOTE — Patient Instructions (Signed)
 Visit Information  Cameron [name] was given information about Medicaid Managed Care team care coordination services as a part of their [employer] Medicaid benefit.   If you would like to schedule transportation through your [facility] plan, please call the following number at least 2 days in advance of your appointment: [phone]  If you are experiencing a behavioral health crisis, call the [employer] [place]  Behavioral Health Crisis Line at [phone]). The line is available 24 hours a day, seven days a week.     Social Worker will follow up on 09/27/24 at11am.  Thersia Hoar, BSW, MHA [place]  [facility] Social Worker, Population Health [phone]   Following is a copy of your plan of care:  There are no care plans that you recently modified to display for this patient.
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
## 2024-09-09 ENCOUNTER — Encounter: Payer: Self-pay | Admitting: Pharmacist

## 2024-09-09 ENCOUNTER — Other Ambulatory Visit

## 2024-09-09 NOTE — Progress Notes (Deleted)
   09/09/2024 Name: KAINOA SWOBODA [name] MRN: [id number] DOB: Oct 27, 1971  Subjective  No chief complaint on file.   Care Team: Primary Care Provider: Bair, Kalpana, MD  Reason for visit: ?  Cameron [name] is a 53 y.o. male who presents today for a telephone visit with the pharmacist due to medication access concerns regarding recent insurance change.    Medication Access: ?  Reports that all medications are not affordable.  Had medication coverage through [facility] though his plan ended.  Opted for Marketplace plan, [facility].  Told SW he also has Medicaid ***  Prescription drug coverage: YES Payor: O[employer] HE[employer] / Plan: OSCAR CIRCLE / Product Type: *No Product type* / .  [place] MEDICAID AMERIHEALTH CARITAS [place]  Summary of Benefit: Test claim shows both Medicaid and [place] are currently active. Successful claim on lasix  for $4.   Current Patient Assistance: N/A - [place] Medicaid   Assessment and Plan:   1. Medication Access Prescription coverage through marketplace plan and [place] Medicaid.  Most medications are covered for $4/month with the exception of ***  Future Appointments  Date Time Provider Department Center  09/27/2024 11:00 AM Delene Thersia PARAS [facility] None  09/30/2024  1:00 PM Land, [place] M, LCSW [facility] None  10/04/2024  1:00 PM Devra Lands, RN [facility] None  11/06/2024  3:00 PM Bair, Luke, MD [facility] [address] FABIENE Geronimo, PharmD Clinical Pharmacist [facility] [phone]

## 2024-09-09 NOTE — Progress Notes (Signed)
 Attempted to contact patient for scheduled appointment for medication management. Left HIPAA compliant message for patient to return my call at their convenience. MyChart message also sent.

## 2024-09-12 ENCOUNTER — Telehealth: Payer: Self-pay

## 2024-09-12 ENCOUNTER — Other Ambulatory Visit: Payer: Self-pay | Admitting: Pharmacist

## 2024-09-12 DIAGNOSIS — J45909 Unspecified asthma, uncomplicated: Secondary | ICD-10-CM

## 2024-09-12 MED ORDER — ALBUTEROL SULFATE HFA 108 (90 BASE) MCG/ACT IN AERS
2.0000 | INHALATION_SPRAY | Freq: Four times a day (QID) | RESPIRATORY_TRACT | 2 refills | Status: AC | PRN
Start: 2024-09-12 — End: ?

## 2024-09-12 NOTE — Telephone Encounter (Signed)
 Patient called [facility] and wanted to speak directly with someone in our front office.  Patient states he missed a couple of appointments, one with Manuelita Kobs, [facility], and another one with a Child psychotherapist.  Patient states he would like to speak with Manuelita Kobs, [facility], today, if  possible.  Patient states he has questions about his medications.  Patient states he is low on his  albuterol  (VENTOLIN  HFA) 108 (90 Base) MCG/ACT inhaler and he thought someone was going to send it in to the [facility].  Prescription Request  09/12/2024  LOV: Visit date not found  What is the name of the medication or equipment?  albuterol  (VENTOLIN  HFA) 108 (90 Base) MCG/ACT inhaler   Have you contacted your pharmacy to request a refill? No   Which pharmacy would you like this sent to?   [facility] [address] [place] [address] Phone: [phone] Fax: [phone]    Patient notified that their request is being sent to the clinical staff for review and that they should receive a response within 2 business days.   Please advise at Mobile [phone] (mobile)  Patient states he would like for Manuelita Kobs, [facility], to please call him today.  Patient states he would like for us  to please call him when the prescription has been sent to the pharmacy.

## 2024-09-12 NOTE — Progress Notes (Signed)
   09/09/2024 Name: Cameron [name] MRN: [id number] DOB: 05-Jun-1971  Subjective  Chief Complaint  Patient presents with   Medication Access   Care Team: Primary Care Provider: Bair, Kalpana, MD  Reason for visit: ?  Cameron [name] is a 53 y.o. male who presents today for a telephone visit with the pharmacist due to medication access concerns regarding recent insurance change.   Medication Access: ?  Reports that all medications are not affordable.  Had medication coverage through [facility] though his plan ended after accidentally signing up for [facility].  Opted for Marketplace plan, [facility].  Told SW he also has Medicaid  Prescription drug coverage: YES Payor: OSCAR HEALTH / Plan: OSCAR CIRCLE / Product Type: *No Product type* / .  [place] MEDICAID AMERIHEALTH CARITAS OF [place]  Summary of Benefit: Test claim shows both Medicaid and [place] are currently active. Successful claim on lasix  for $4.Albuterol  - $4 Medicaid Nurtec - preferred, ideally $4  Current Patient Assistance: N/A - [place] Medicaid   Assessment and Plan:   1. Medication Access Prescription coverage through marketplace plan and [place] Medicaid.  Most medications are covered for $4/month with the exception of albuterol . He was told by CS this is $30. Nurtec   Future Appointments  Date Time Provider Department Center  09/27/2024 11:00 AM Delene Thersia PARAS [facility] None  09/30/2024  1:00 PM Land, [place] M, LCSW [facility] None  10/04/2024  1:00 PM Devra Lands, RN [facility] None  11/06/2024  3:00 PM Abbey Bruckner, MD [facility] [address] FABIENE Geronimo, PharmD Clinical Pharmacist [facility] [phone]  Albuterol  refilled in setting of asthma Rollene Northern, NP

## 2024-09-27 ENCOUNTER — Telehealth: Payer: Self-pay

## 2024-09-27 NOTE — Patient Instructions (Signed)
 Cameron [name] - I am sorry I was unable to reach you today for our scheduled appointment. I work with Bair, Kalpana, MD and am calling to support your healthcare needs. Please contact me at [phone] at your earliest convenience. I look forward to speaking with you soon.   Thank you,  Thersia Hoar, BSW, MHA [place]  [facility] Social Worker, Population Health [phone]

## 2024-09-30 ENCOUNTER — Other Ambulatory Visit: Payer: Self-pay | Admitting: *Deleted

## 2024-10-01 NOTE — Patient Instructions (Signed)
 Visit Information  Thank you for taking time to visit with me today. Please don't hesitate to contact me if I can be of assistance to you before our next scheduled appointment.  Our next appointment is by telephone on 10/22/24 at 1:30pm Please call the care guide team at [phone] if you need to cancel or reschedule your appointment.   Following is a copy of your care plan:   Goals Addressed             This Visit's Progress    VBCI Social Work Care Plan       Problems:   Depression    CSW Clinical Goal(s):   Over the next 90 days the Patient will work with legal representative to address needs related to estate planning as evidenced by patient's report.  Interventions:  Mental Health:  Evaluation of current treatment plan related to depression and financial strain Active listening / Reflection utilized Mining Engineer reviewed Emotional Support Provided Motivational Interviewing employed PHQ2/PHQ9 completed Solution-Focused Strategies employed:discussed increasing exercise, adjusting perspective, practicing gratitude and use of humor. Further discussed obtaining legal advice to prepare for next court hearing on 10/10/24  Patient Goals/Self-Care Activities:  Increase coping skills, healthy habits, self-management skills, and stress reduction  Plan:   Telephone follow up appointment with care management team member scheduled for:  10/22/24 1:30pm        Please call the Suicide and Crisis Lifeline: 988 call the [place]  National Suicide Prevention Lifeline: 248-626-5007 or TTY: 8327932807 TTY 989-134-7869) to talk to a trained counselor call 1-800-273-TALK (toll free, 24 hour hotline) call 911 if you are experiencing a Mental Health or Behavioral Health Crisis or need someone to talk to.  Patient verbalized understanding of Care plan and visit instructions communicated this visit  Nahomi Hegner, LCSW [place]  [facility], [facility]  Health Licensed Clinical Social Worker  Direct Dial: [phone]
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
## 2024-10-04 ENCOUNTER — Telehealth: Payer: Self-pay

## 2024-10-04 NOTE — Patient Instructions (Signed)
 Cameron [name] - I am sorry I was unable to reach you today for our scheduled appointment. I work with Bair, Kalpana, MD and am calling to support your healthcare needs. Please contact me at [phone] at your earliest convenience. I look forward to speaking with you soon.   Thank you,  Nestora Duos, MSN, RN [facility]  [facility], [facility] RN Care Manager Direct Dial: [phone] Fax: [phone]

## 2024-10-11 ENCOUNTER — Telehealth: Payer: Self-pay

## 2024-10-11 NOTE — Patient Outreach (Signed)
 RNCM - Call #2 to reschedule missed appointment

## 2024-10-11 NOTE — Patient Instructions (Signed)
 Carlin LITTIE Buttner III - I am sorry I was unable to reach you today for our scheduled appointment. I work with Bair, Kalpana, MD and am calling to support your healthcare needs. Please contact me at [phone] at your earliest convenience. I look forward to speaking with you soon.   Thank you,  Thersia Hoar, BSW, MHA [place]  [facility] Social Worker, Population Health [phone]

## 2024-10-14 ENCOUNTER — Telehealth: Payer: Self-pay

## 2024-10-14 NOTE — Patient Outreach (Signed)
 RNCM - unable to reach patient x3. Noted patient  seeing LCSW and appointment 12/1. Closed from [facility] and reassigned to LCSW. PCP and LCSW notified.

## 2024-10-22 ENCOUNTER — Encounter: Payer: Self-pay | Admitting: *Deleted

## 2024-10-22 ENCOUNTER — Telehealth: Payer: Self-pay | Admitting: *Deleted

## 2024-10-22 NOTE — Patient Instructions (Signed)
 Cameron [name] - I am sorry I was unable to reach you today for our scheduled appointment. I work with Bair, Kalpana, MD and am calling to support your healthcare needs. Please contact me at [phone] at your earliest convenience. I look forward to speaking with you soon.   Thank you,    Elic Vencill, LCSW [place]  [facility], [facility] Licensed Clinical Social Worker  Direct Dial: [phone]

## 2024-10-25 ENCOUNTER — Telehealth: Payer: Self-pay

## 2024-10-25 ENCOUNTER — Other Ambulatory Visit: Payer: Self-pay

## 2024-10-25 NOTE — Telephone Encounter (Signed)
 Copied from CRM [id number]. Topic: Clinical - Medication Refill >> Oct 25, 2024 11:28 AM Thersia BROCKS wrote: Medication: acamprosate (CAMPRAL) 333 MG tablet  Has the patient contacted their pharmacy? Yes (Agent: If no, request that the patient contact the pharmacy for the refill. If patient does not wish to contact the pharmacy document the reason why and proceed with request.) (Agent: If yes, when and what did the pharmacy advise?)  This is the patient's preferred pharmacy:  [facility] [address], [place] - [address] [place] [address] Phone: [phone] Fax: [phone]   Is this the correct pharmacy for this prescription? Yes If no, delete pharmacy and type the correct one.   Has the prescription been filled recently? No  Is the patient out of the medication? Yes  Has the patient been seen for an appointment in the last year OR does the patient have an upcoming appointment? Yes  Can we respond through MyChart? Yes  Agent: Please be advised that Rx refills may take up to 3 business days. We ask that you follow-up with your pharmacy. >> Oct 25, 2024 11:33 AM Thersia C wrote: Patient called in regarding albuterol  (VENTOLIN  HFA) 108 (90 Base) MCG/ACT inhaler needs that refilled as well

## 2024-10-25 NOTE — Telephone Encounter (Signed)
 Copied from CRM [id number]. Topic: Clinical - Medication Refill >> Oct 25, 2024 11:28 AM Thersia BROCKS wrote: Medication: acamprosate (CAMPRAL) 333 MG tablet  Has the patient contacted their pharmacy? Yes (Agent: If no, request that the patient contact the pharmacy for the refill. If patient does not wish to contact the pharmacy document the reason why and proceed with request.) (Agent: If yes, when and what did the pharmacy advise?)  This is the patient's preferred pharmacy:  [facility] [address], [place] - [address] [place] [address] Phone: [phone] Fax: [phone]   Is this the correct pharmacy for this prescription? Yes If no, delete pharmacy and type the correct one.   Has the prescription been filled recently? No  Is the patient out of the medication? Yes  Has the patient been seen for an appointment in the last year OR does the patient have an upcoming appointment? Yes  Can we respond through MyChart? Yes  Agent: Please be advised that Rx refills may take up to 3 business days. We ask that you follow-up with your pharmacy.

## 2024-10-28 NOTE — Telephone Encounter (Signed)
 I have never prescribed Acamprosate to the patient in the past. I need to discuss this with him during his up coming appointment with me in 11/06/24 before refill can be sent. Please update patient on above recommendation.   Thank you,  Luke Shade, MD

## 2024-10-28 NOTE — Telephone Encounter (Signed)
 Left message for patient to give our office a call back to discuss Dr Graylon recommendations.   OK for E2C2 to give note if patient calls back. If relayed, please notify the office.

## 2024-11-06 ENCOUNTER — Ambulatory Visit

## 2024-11-12 ENCOUNTER — Telehealth: Admitting: *Deleted

## 2024-11-12 ENCOUNTER — Encounter: Payer: Self-pay | Admitting: *Deleted

## 2024-11-12 NOTE — Patient Instructions (Signed)
 Carlin LITTIE Buttner III - I am sorry I was unable to reach you today. I work with Bair, Kalpana, MD and am calling to support your healthcare needs. Please contact me at [phone] at your earliest convenience. I look forward to speaking with you soon.   Thank you,    Ukiah Trawick, LCSW [place]  [facility], [facility] Licensed Clinical Social Worker  Direct Dial: [phone]

## 2024-12-02 ENCOUNTER — Other Ambulatory Visit: Payer: Self-pay | Admitting: *Deleted

## 2024-12-02 NOTE — Patient Instructions (Signed)
 Carlin LITTIE Buttner III - I have attempted to call you three times but have been unsuccessful in reaching you. I work with Bair, Kalpana, MD and am calling to support your healthcare needs. If I can be of assistance to you, please contact me at [phone].     Thank you,    Katie Moch, LCSW [place]  [facility], [facility] Licensed Clinical Social Worker  Direct Dial: [phone]

## 2024-12-19 ENCOUNTER — Ambulatory Visit: Payer: Self-pay
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
## 2024-12-24 ENCOUNTER — Ambulatory Visit

## 2024-12-30 ENCOUNTER — Ambulatory Visit: Admitting: Internal Medicine
